# Patient Record
Sex: Female | Born: 1990 | Race: Black or African American | Hispanic: No | Marital: Single | Smoking: Never smoker
Health system: Southern US, Community
[De-identification: ages and names within clinical notes are randomized; demographics above are authoritative.]

## PROBLEM LIST (undated history)

## (undated) DIAGNOSIS — K219 Gastro-esophageal reflux disease without esophagitis: Secondary | ICD-10-CM

## (undated) DIAGNOSIS — B009 Herpesviral infection, unspecified: Secondary | ICD-10-CM

## (undated) DIAGNOSIS — D649 Anemia, unspecified: Secondary | ICD-10-CM

## (undated) DIAGNOSIS — A64 Unspecified sexually transmitted disease: Secondary | ICD-10-CM

## (undated) DIAGNOSIS — Z789 Other specified health status: Secondary | ICD-10-CM

## (undated) HISTORY — PX: NO PAST SURGERIES: SHX2092

## (undated) HISTORY — DX: Unspecified sexually transmitted disease: A64

---

## 2011-09-22 ENCOUNTER — Emergency Department (HOSPITAL_COMMUNITY)
Admission: EM | Admit: 2011-09-22 | Discharge: 2011-09-22 | Disposition: A | Discharge status: Home / Self Care | Payer: No Typology Code available for payment source | Attending: Emergency Medicine | Admitting: Emergency Medicine

## 2011-09-22 ENCOUNTER — Emergency Department (HOSPITAL_COMMUNITY): Payer: No Typology Code available for payment source

## 2011-09-22 ENCOUNTER — Encounter (HOSPITAL_COMMUNITY): Payer: Self-pay | Admitting: Emergency Medicine

## 2011-09-22 DIAGNOSIS — R109 Unspecified abdominal pain: Secondary | ICD-10-CM | POA: Insufficient documentation

## 2011-09-22 DIAGNOSIS — S161XXA Strain of muscle, fascia and tendon at neck level, initial encounter: Secondary | ICD-10-CM

## 2011-09-22 DIAGNOSIS — M542 Cervicalgia: Secondary | ICD-10-CM | POA: Insufficient documentation

## 2011-09-22 DIAGNOSIS — M25519 Pain in unspecified shoulder: Secondary | ICD-10-CM | POA: Insufficient documentation

## 2011-09-22 DIAGNOSIS — S139XXA Sprain of joints and ligaments of unspecified parts of neck, initial encounter: Secondary | ICD-10-CM | POA: Insufficient documentation

## 2011-09-22 LAB — URINALYSIS, ROUTINE W REFLEX MICROSCOPIC
Leukocytes, UA: NEGATIVE
Nitrite: NEGATIVE
Specific Gravity, Urine: 1.031 — ABNORMAL HIGH (ref 1.005–1.030)
Urobilinogen, UA: 1 mg/dL (ref 0.0–1.0)
pH: 6 (ref 5.0–8.0)

## 2011-09-22 MED ORDER — IBUPROFEN 800 MG PO TABS: 800.0000 mg | ORAL_TABLET | Freq: Three times a day (TID) | ORAL | Status: AC

## 2011-09-22 MED ORDER — DIAZEPAM 5 MG PO TABS: 5.0000 mg | ORAL_TABLET | Freq: Two times a day (BID) | ORAL | Status: AC

## 2011-09-22 MED ORDER — HYDROCODONE-ACETAMINOPHEN 5-325 MG PO TABS
1.0000 | ORAL_TABLET | Freq: Once | ORAL | Status: AC
  Administered 2011-09-22: 1 via ORAL
  Filled 2011-09-22: qty 1

## 2011-09-22 NOTE — ED Notes (Signed)
Family at bedside. 
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
## 2011-09-23 NOTE — ED Provider Notes (Signed)
Medical screening examination/treatment/procedure(s) were performed by non-physician practitioner and as supervising physician I was immediately available for consultation/collaboration.   Gerhard Munch, MD 09/23/11 [phone]

## 2011-10-03 ENCOUNTER — Ambulatory Visit: Payer: BC Managed Care – PPO

## 2012-04-18 ENCOUNTER — Ambulatory Visit (INDEPENDENT_AMBULATORY_CARE_PROVIDER_SITE_OTHER): Payer: BC Managed Care – PPO | Admitting: Physician Assistant

## 2012-04-18 VITALS — BP 104/76 | HR 64 | Temp 98.2°F | Resp 16 | Ht 64.0 in | Wt 110.0 lb

## 2012-04-18 DIAGNOSIS — R350 Frequency of micturition: Secondary | ICD-10-CM

## 2012-04-18 DIAGNOSIS — R300 Dysuria: Secondary | ICD-10-CM

## 2012-04-18 LAB — POCT UA - MICROSCOPIC ONLY
Crystals, Ur, HPF, POC: NEGATIVE
Yeast, UA: NEGATIVE

## 2012-04-18 LAB — POCT URINALYSIS DIPSTICK
Ketones, UA: NEGATIVE
Nitrite, UA: NEGATIVE
Protein, UA: NEGATIVE
pH, UA: 7

## 2012-04-18 MED ORDER — CIPROFLOXACIN HCL 500 MG PO TABS: 500.0000 mg | ORAL_TABLET | Freq: Two times a day (BID) | ORAL | Status: DC

## 2012-04-18 NOTE — Progress Notes (Signed)
  Subjective:    Patient ID: Jeanne Harper, female    DOB: 02-Mar-1991, 21 y.o.   MRN: [id number]  HPI 21 year old female presents with acute onset of dysuria and urinary frequency.  States she had her annual GYN exam last week and was treated for yeast and bacterial vaginosis. Never had vaginal discharge and says she does not have any vaginal complaints now.  Denies abdominal pain, nausea, vomiting, flank pain, fevers, or chills.  She is currently on her menstrual cycle.     Review of Systems  All other systems reviewed and are negative.       Objective:   Physical Exam  Constitutional: She is oriented to person, place, and time. She appears well-developed and well-nourished.  HENT:  Head: Normocephalic and atraumatic.  Right Ear: External ear normal.  Left Ear: External ear normal.  Eyes: Conjunctivae normal are normal.  Neck: Normal range of motion.  Cardiovascular: Normal rate, regular rhythm and normal heart sounds.   Pulmonary/Chest: Effort normal and breath sounds normal.  Abdominal: Soft. Bowel sounds are normal. There is no tenderness (no CVA tenderness).  Musculoskeletal: Normal range of motion.  Neurological: She is alert and oriented to person, place, and time.  Psychiatric: She has a normal mood and affect. Her behavior is normal. Judgment and thought content normal.    Results for orders placed in visit on 04/18/12  POCT UA - MICROSCOPIC ONLY      Component Value Range   WBC, Ur, HPF, POC TNTC     RBC, urine, microscopic TNTC     Bacteria, U Microscopic 1+     Mucus, UA negative     Epithelial cells, urine per micros 2-7     Crystals, Ur, HPF, POC negative     Casts, Ur, LPF, POC negative     Yeast, UA negative    POCT URINALYSIS DIPSTICK      Component Value Range   Color, UA yellow     Clarity, UA cloudy     Glucose, UA negative     Bilirubin, UA negative     Ketones, UA negative     Spec Grav, UA 1.025     Blood, UA large     pH, UA 7.0     Protein, UA negative     Urobilinogen, UA 0.2     Nitrite, UA negative     Leukocytes, UA small (1+)           Assessment & Plan:   1. Dysuria  POCT UA - Microscopic Only, POCT urinalysis dipstick, Urine culture, ciprofloxacin (CIPRO) 500 MG tablet  2. Urinary frequency     Start Cipro 500 mg bid Urine culture sent Follow up if symptoms worsen or fail to improve.

## 2012-04-21 LAB — URINE CULTURE: Colony Count: 70000

## 2013-04-09 ENCOUNTER — Encounter: Payer: Self-pay | Admitting: Certified Nurse Midwife

## 2013-04-10 ENCOUNTER — Ambulatory Visit (INDEPENDENT_AMBULATORY_CARE_PROVIDER_SITE_OTHER): Payer: BC Managed Care – PPO | Admitting: Certified Nurse Midwife

## 2013-04-10 ENCOUNTER — Encounter: Payer: Self-pay | Admitting: Certified Nurse Midwife

## 2013-04-10 VITALS — BP 90/64 | HR 64 | Resp 16 | Ht 58.5 in | Wt 111.0 lb

## 2013-04-10 DIAGNOSIS — Z0000 Encounter for general adult medical examination without abnormal findings: Secondary | ICD-10-CM

## 2013-04-10 DIAGNOSIS — IMO0001 Reserved for inherently not codable concepts without codable children: Secondary | ICD-10-CM

## 2013-04-10 DIAGNOSIS — Z309 Encounter for contraceptive management, unspecified: Secondary | ICD-10-CM

## 2013-04-10 DIAGNOSIS — Z01419 Encounter for gynecological examination (general) (routine) without abnormal findings: Secondary | ICD-10-CM

## 2013-04-10 DIAGNOSIS — N760 Acute vaginitis: Secondary | ICD-10-CM

## 2013-04-10 MED ORDER — ETONOGESTREL-ETHINYL ESTRADIOL 0.12-0.015 MG/24HR VA RING: VAGINAL_RING | VAGINAL | Status: DC

## 2013-04-10 MED ORDER — METRONIDAZOLE 0.75 % VA GEL: 1.0000 | Freq: Every day | VAGINAL | Status: DC

## 2013-04-10 NOTE — Patient Instructions (Signed)
General topics  Next pap or exam is  due in 1 year Take a Women's multivitamin Take 1200 mg. of calcium daily - prefer dietary If any concerns in interim to call back  Breast Self-Awareness Practicing breast self-awareness may pick up problems early, prevent significant medical complications, and possibly save your life. By practicing breast self-awareness, you can become familiar with how your breasts look and feel and if your breasts are changing. This allows you to notice changes early. It can also offer you some reassurance that your breast health is good. One way to learn what is normal for your breasts and whether your breasts are changing is to do a breast self-exam. If you find a lump or something that was not present in the past, it is best to contact your caregiver right away. Other findings that should be evaluated by your caregiver include nipple discharge, especially if it is bloody; skin changes or reddening; areas where the skin seems to be pulled in (retracted); or new lumps and bumps. Breast pain is seldom associated with cancer (malignancy), but should also be evaluated by a caregiver. BREAST SELF-EXAM The best time to examine your breasts is 5 7 days after your menstrual period is over.  ExitCare Patient Information 2013 [employer].   Exercise to Stay Healthy Exercise helps you become and stay healthy. EXERCISE IDEAS AND TIPS Choose exercises that:  You enjoy.  Fit into your day. You do not need to exercise really hard to be healthy. You can do exercises at a slow or medium level and stay healthy. You can:  Stretch before and after working out.  Try yoga, Pilates, or tai chi.  Lift weights.  Walk fast, swim, jog, run, climb stairs, bicycle, dance, or rollerskate.  Take aerobic classes. Exercises that burn about 150 calories:  Running 1  miles in 15 minutes.  Playing volleyball for 45 to 60 minutes.  Washing and waxing a car for 45 to 60  minutes.  Playing touch football for 45 minutes.  Walking 1  miles in 35 minutes.  Pushing a stroller 1  miles in 30 minutes.  Playing basketball for 30 minutes.  Raking leaves for 30 minutes.  Bicycling 5 miles in 30 minutes.  Walking 2 miles in 30 minutes.  Dancing for 30 minutes.  Shoveling snow for 15 minutes.  Swimming laps for 20 minutes.  Walking up stairs for 15 minutes.  Bicycling 4 miles in 15 minutes.  Gardening for 30 to 45 minutes.  Jumping rope for 15 minutes.  Washing windows or floors for 45 to 60 minutes. Document Released: 08/13/2010 Document Revised: 10/03/2011 Document Reviewed: 08/13/2010 [employer] Patient Information 2013 [employer].   Other topics ( that may be useful information):    Sexually Transmitted Disease Sexually transmitted disease (STD) refers to any infection that is passed from person to person during sexual activity. This may happen by way of saliva, semen, blood, vaginal mucus, or urine. Common STDs include:  Gonorrhea.  Chlamydia.  Syphilis.  HIV/AIDS.  Genital herpes.  Hepatitis B and C.  Trichomonas.  Human papillomavirus (HPV).  Pubic lice. CAUSES  An STD may be spread by bacteria, virus, or parasite. A person can get an STD by:  Sexual intercourse with an infected person.  Sharing sex toys with an infected person.  Sharing needles with an infected person.  Having intimate contact with the genitals, mouth, or rectal areas of an infected person. SYMPTOMS  Some people may not have any symptoms, but   they can still pass the infection to others. Different STDs have different symptoms. Symptoms include:  Painful or bloody urination.  Pain in the pelvis, abdomen, vagina, anus, throat, or eyes.  Skin rash, itching, irritation, growths, or sores (lesions). These usually occur in the genital or anal area.  Abnormal vaginal discharge.  Penile discharge in men.  Soft, flesh-colored skin growths in the  genital or anal area.  Fever.  Pain or bleeding during sexual intercourse.  Swollen glands in the groin area.  Yellow skin and eyes (jaundice). This is seen with hepatitis. DIAGNOSIS  To make a diagnosis, your caregiver may:  Take a medical history.  Perform a physical exam.  Take a specimen (culture) to be examined.  Examine a sample of discharge under a microscope.  Perform blood test TREATMENT   Chlamydia, gonorrhea, trichomonas, and syphilis can be cured with antibiotic medicine.  Genital herpes, hepatitis, and HIV can be treated, but not cured, with prescribed medicines. The medicines will lessen the symptoms.  Genital warts from HPV can be treated with medicine or by freezing, burning (electrocautery), or surgery. Warts may come back.  HPV is a virus and cannot be cured with medicine or surgery.However, abnormal areas may be followed very closely by your caregiver and may be removed from the cervix, vagina, or vulva through office procedures or surgery. If your diagnosis is confirmed, your recent sexual partners need treatment. This is true even if they are symptom-free or have a negative culture or evaluation. They should not have sex until their caregiver says it is okay. HOME CARE INSTRUCTIONS  All sexual partners should be informed, tested, and treated for all STDs.  Take your antibiotics as directed. Finish them even if you start to feel better.  Only take over-the-counter or prescription medicines for pain, discomfort, or fever as directed by your caregiver.  Rest.  Eat a balanced diet and drink enough fluids to keep your urine clear or pale yellow.  Do not have sex until treatment is completed and you have followed up with your caregiver. STDs should be checked after treatment.  Keep all follow-up appointments, Pap tests, and blood tests as directed by your caregiver.  Only use latex condoms and water-soluble lubricants during sexual activity. Do not use  petroleum jelly or oils.  Avoid alcohol and illegal drugs.  Get vaccinated for HPV and hepatitis. If you have not received these vaccines in the past, talk to your caregiver about whether one or both might be right for you.  Avoid risky sex practices that can break the skin. The only way to avoid getting an STD is to avoid all sexual activity.Latex condoms and dental dams (for oral sex) will help lessen the risk of getting an STD, but will not completely eliminate the risk. SEEK MEDICAL CARE IF:   You have a fever.  You have any new or worsening symptoms. Document Released: 10/01/2002 Document Revised: 10/03/2011 Document Reviewed: 10/08/2010 Exit[employer] Patient Information 2013 [employer].    Domestic Abuse You are being battered or abused if someone close to you hits, pushes, or physically hurts you in any way. You also are being abused if you are forced into activities. You are being sexually abused if you are forced to have sexual contact of any kind. You are being emotionally abused if you are made to feel worthless or if you are constantly threatened. It is important to remember that help is available. No one has the right to abuse you. PREVENTION OF FURTHER   ABUSE  Learn the warning signs of danger. This varies with situations but may include: the use of alcohol, threats, isolation from friends and family, or forced sexual contact. Leave if you feel that violence is going to occur.  If you are attacked or beaten, report it to the police so the abuse is documented. You do not have to press charges. The police can protect you while you or the attackers are leaving. Get the officer's name and badge number and a copy of the report.  Find someone you can trust and tell them what is happening to you: your caregiver, a nurse, clergy member, close friend or family member. Feeling ashamed is natural, but remember that you have done nothing wrong. No one deserves abuse. Document Released:  07/08/2000 Document Revised: 10/03/2011 Document Reviewed: 09/16/2010 Exit[employer] Patient Information 2013 [employer].    How Much is Too Much Alcohol? Drinking too much alcohol can cause injury, accidents, and health problems. These types of problems can include:   Car crashes.  Falls.  Family fighting (domestic violence).  Drowning.  Fights.  Injuries.  Burns.  Damage to certain organs.  Having a baby with birth defects. ONE DRINK CAN BE TOO MUCH WHEN YOU ARE:  Working.  Pregnant or breastfeeding.  Taking medicines. Ask your doctor.  Driving or planning to drive. If you or someone you know has a drinking problem, get help from a doctor.  Document Released: 05/07/2009 Document Revised: 10/03/2011 Document Reviewed: 05/07/2009 Exit[employer] Patient Information 2013 [employer].   Smoking Hazards Smoking cigarettes is extremely bad for your health. Tobacco smoke has over 200 known poisons in it. There are over 60 chemicals in tobacco smoke that cause cancer. Some of the chemicals found in cigarette smoke include:   Cyanide.  Benzene.  Formaldehyde.  Methanol (wood alcohol).  Acetylene (fuel used in welding torches).  Ammonia. Cigarette smoke also contains the poisonous gases nitrogen oxide and carbon monoxide.  Cigarette smokers have an increased risk of many serious medical problems and Smoking causes approximately:  90% of all lung cancer deaths in men.  80% of all lung cancer deaths in women.  90% of deaths from chronic obstructive lung disease. Compared with nonsmokers, smoking increases the risk of:  Coronary heart disease by 2 to 4 times.  Stroke by 2 to 4 times.  Men developing lung cancer by 23 times.  Women developing lung cancer by 13 times.  Dying from chronic obstructive lung diseases by 12 times.  . Smoking is the most preventable cause of death and disease in our society.  WHY IS SMOKING ADDICTIVE?  Nicotine is the chemical  agent in tobacco that is capable of causing addiction or dependence.  When you smoke and inhale, nicotine is absorbed rapidly into the bloodstream through your lungs. Nicotine absorbed through the lungs is capable of creating a powerful addiction. Both inhaled and non-inhaled nicotine may be addictive.  Addiction studies of cigarettes and spit tobacco show that addiction to nicotine occurs mainly during the teen years, when young people begin using tobacco products. WHAT ARE THE BENEFITS OF QUITTING?  There are many health benefits to quitting smoking.   Likelihood of developing cancer and heart disease decreases. Health improvements are seen almost immediately.  Blood pressure, pulse rate, and breathing patterns start returning to normal soon after quitting. QUITTING SMOKING   American Lung Association - 1-800-LUNGUSA  American Cancer Society - 1-800-ACS-2345 Document Released: 08/18/2004 Document Revised: 10/03/2011 Document Reviewed: 04/22/2009 Exit[employer] Patient Information 2013 [employer].   Stress Management Stress is a state of physical or mental tension that often results from changes in your life or normal routine. Some common causes of stress are:  Death of a loved one.  Injuries or severe illnesses.  Getting fired or changing jobs.  Moving into a new home. Other causes may be:  Sexual problems.  Business or financial losses.  Taking on a large debt.  Regular conflict with someone at home or at work.  Constant tiredness from lack of sleep. It is not just bad things that are stressful. It may be stressful to:  Win the lottery.  Get married.  Buy a new car. The amount of stress that can be easily tolerated varies from person to person. Changes generally cause stress, regardless of the types of change. Too much stress can affect your health. It may lead to physical or emotional problems. Too little stress (boredom) may also become stressful. SUGGESTIONS TO  REDUCE STRESS:  Talk things over with your family and friends. It often is helpful to share your concerns and worries. If you feel your problem is serious, you may want to get help from a professional counselor.  Consider your problems one at a time instead of lumping them all together. Trying to take care of everything at once may seem impossible. List all the things you need to do and then start with the most important one. Set a goal to accomplish 2 or 3 things each day. If you expect to do too many in a single day you will naturally fail, causing you to feel even more stressed.  Do not use alcohol or drugs to relieve stress. Although you may feel better for a short time, they do not remove the problems that caused the stress. They can also be habit forming.  Exercise regularly - at least 3 times per week. Physical exercise can help to relieve that "uptight" feeling and will relax you.  The shortest distance between despair and hope is often a good night's sleep.  Go to bed and get up on time allowing yourself time for appointments without being rushed.  Take a short "time-out" period from any stressful situation that occurs during the day. Close your eyes and take some deep breaths. Starting with the muscles in your face, tense them, hold it for a few seconds, then relax. Repeat this with the muscles in your neck, shoulders, hand, stomach, back and legs.  Take good care of yourself. Eat a balanced diet and get plenty of rest.  Schedule time for having fun. Take a break from your daily routine to relax. HOME CARE INSTRUCTIONS   Call if you feel overwhelmed by your problems and feel you can no longer manage them on your own.  Return immediately if you feel like hurting yourself or someone else. Document Released: 01/04/2001 Document Revised: 10/03/2011 Document Reviewed: 08/27/2007 Exit[employer] Patient Information 2013 [employer].   

## 2013-04-10 NOTE — Progress Notes (Signed)
22 y.o. G0P0000 Single African American Fe here for annual exam.  Periods normal, no issues. No STD screening needed, no partner change. Contraception condoms but would like to try Nuvaring, had a problem remembering pills. Also has noticed increased vaginal discharge, no burning or itching, slight odor. Denies new personal products. No other health issues.  Patient's last menstrual period was 03/13/2013.          Sexually active: yes  The current method of family planning is condoms most of the time.    Exercising: yes  cardio & strength training Smoker:  no  Health Maintenance: Pap: 04-09-12 neg MMG:  none Colonoscopy:  none BMD:   none TDaP:  2008 Labs:Hgb-13.4 Self breast exam: done occ   reports that she has never smoked. She does not have any smokeless tobacco history on file. She reports that  drinks alcohol. She reports that she does not use illicit drugs.  History reviewed. No pertinent past medical history.  History reviewed. No pertinent past surgical history.  Current Outpatient Prescriptions  Medication Sig Dispense Refill  . IBUPROFEN PO Take by mouth as needed.       No current facility-administered medications for this visit.    Family History  Problem Relation Age of Onset  . Hypertension Mother     ROS:  Pertinent items are noted in HPI.  Otherwise, a comprehensive ROS was negative.  Exam:   BP 90/64  Pulse 64  Resp 16  Ht 4' 10.5" (1.486 m)  Wt 111 lb (50.349 kg)  BMI 22.8 kg/m2  LMP 03/13/2013 Height: 4' 10.5" (148.6 cm)  Ht Readings from Last 3 Encounters:  04/10/13 4' 10.5" (1.486 m)  04/18/12 5\' 4"  (1.626 m)    General appearance: alert, cooperative and appears stated age Head: Normocephalic, without obvious abnormality, atraumatic Neck: no adenopathy, supple, symmetrical, trachea midline and thyroid normal to inspection and palpation Lungs: clear to auscultation bilaterally Breasts: normal appearance, no masses or tenderness, No nipple  retraction or dimpling, No nipple discharge or bleeding, No axillary or supraclavicular adenopathy Heart: regular rate and rhythm Abdomen: soft, non-tender; no masses,  no organomegaly Extremities: extremities normal, atraumatic, no cyanosis or edema Skin: Skin color, texture, turgor normal. No rashes or lesions Lymph nodes: Cervical, supraclavicular, and axillary nodes normal. No abnormal inguinal nodes palpated Neurologic: Grossly normal   Pelvic: External genitalia:  no lesions              Urethra:  normal appearing urethra with no masses, tenderness or lesions              Bartholin's and Skene's: normal                 Vagina: normal appearing vagina with normal color and odorous  discharge, no lesions ph 5.0  Wet Prep taken              Cervix: normal ,non tender              Pap taken: no Bimanual Exam:  Uterus:  normal size, contour, position, consistency, mobility, non-tender and anteverted              Adnexa: normal adnexa and no mass, fullness, tenderness               Rectovaginal: Confirms               Anus: deferred  Wet Prep: Positive for BV, negative for yeast, trich  A:  Well Woman  with normal exam  Contraception Nuvaring desired  BV  P:   Reviewed health and wellness pertinent to exam  Rx Nuvaring see order.  Patient inserted and removed Nuvaring without problems. Provider checked for proper placement. Instructions given for use.  Reviewed findings. Rx Metrogel see order  Pap smear as per guidelines   pap smear not taken today  counseled on breast self exam, STD prevention, adequate intake of calcium and vitamin D, diet and exercise  return annually or prn  An After Visit Summary was printed and given to the patient.

## 2013-04-15 NOTE — Progress Notes (Signed)
Note reviewed, agree with plan.  Renarda Mullinix, MD  

## 2013-04-26 ENCOUNTER — Telehealth: Payer: Self-pay | Admitting: Certified Nurse Midwife

## 2013-04-26 NOTE — Telephone Encounter (Signed)
Message left to return call to Jennefer Kopp at [phone].    

## 2013-04-26 NOTE — Telephone Encounter (Signed)
Patient has some questions about the Birth Control she just started.

## 2013-04-30 NOTE — Telephone Encounter (Signed)
Message left to return call to Breella Vanostrand at [phone].    

## 2014-04-14 ENCOUNTER — Ambulatory Visit: Payer: BC Managed Care – PPO | Admitting: Certified Nurse Midwife

## 2014-04-14 ENCOUNTER — Encounter: Payer: Self-pay | Admitting: Certified Nurse Midwife

## 2014-04-18 ENCOUNTER — Other Ambulatory Visit: Payer: Self-pay | Admitting: Gastroenterology

## 2014-04-18 DIAGNOSIS — R1084 Generalized abdominal pain: Secondary | ICD-10-CM

## 2014-04-29 ENCOUNTER — Ambulatory Visit
Admission: RE | Admit: 2014-04-29 | Discharge: 2014-04-29 | Disposition: A | Discharge status: Home / Self Care | Payer: BC Managed Care – PPO | Source: Ambulatory Visit | Attending: Gastroenterology | Admitting: Gastroenterology

## 2014-04-29 DIAGNOSIS — R1084 Generalized abdominal pain: Secondary | ICD-10-CM

## 2014-08-22 ENCOUNTER — Encounter: Payer: Self-pay | Admitting: Nurse Practitioner

## 2014-09-08 ENCOUNTER — Ambulatory Visit: Payer: Self-pay | Admitting: Certified Nurse Midwife

## 2015-06-17 IMAGING — US US ABDOMEN COMPLETE
1 series · 14 of 25 positions shown · non-contrast
Comparison: None.

CLINICAL DATA: Initial visit generalized abdominal pain

EXAM:
ULTRASOUND ABDOMEN COMPLETE

[Series 1: us abdomen complete · 0.20mm/px · 14 of 90 slices shown]
[im 1/90]
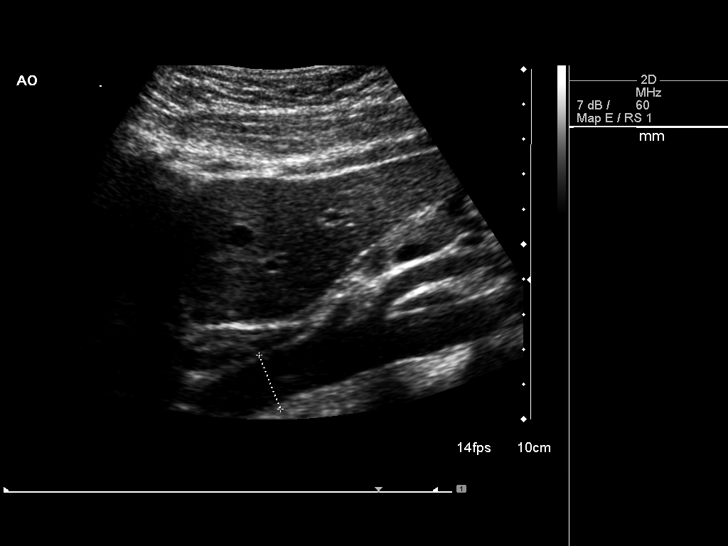
[im 8/90]
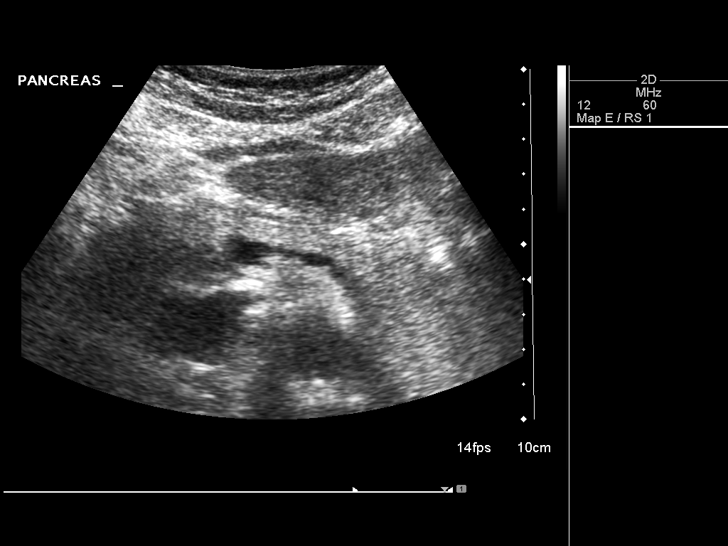
[im 15/90]
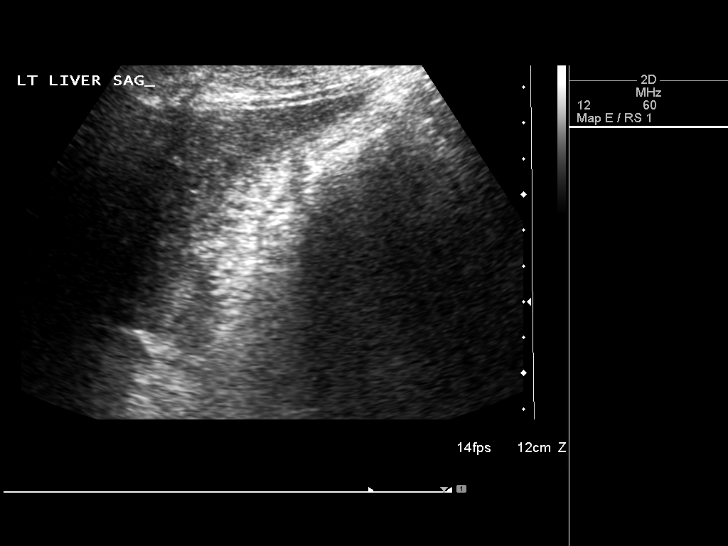
[im 23/90]
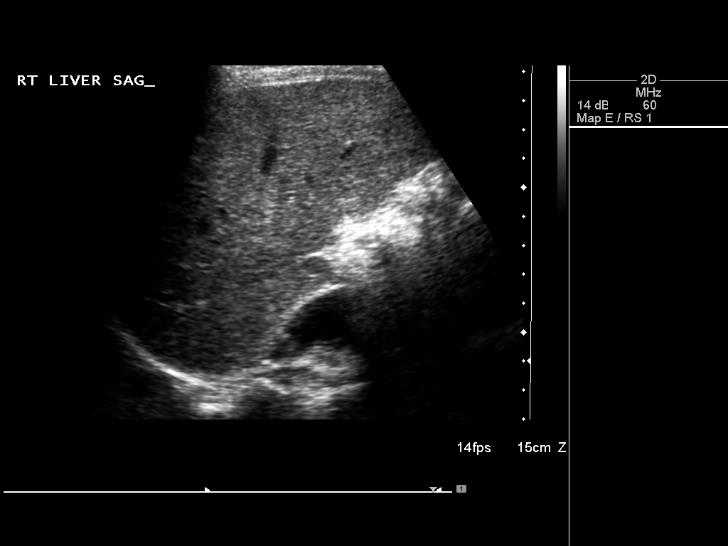
[im 30/90]
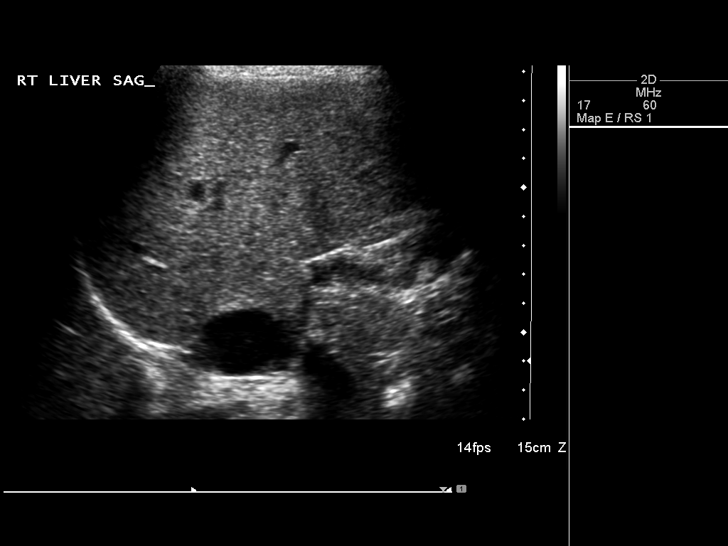
[im 34/90]
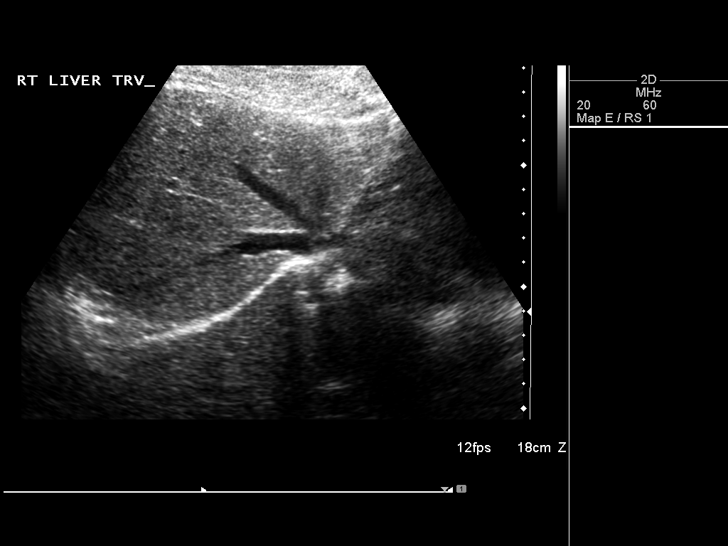
[im 41/90]
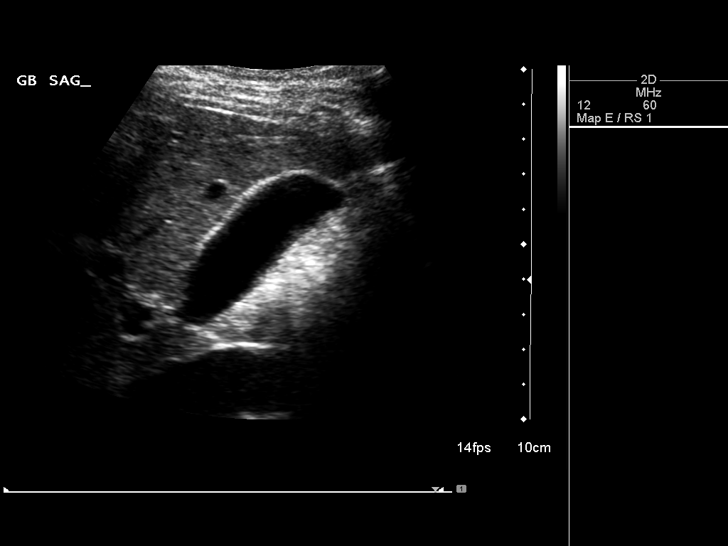
[im 49/90]
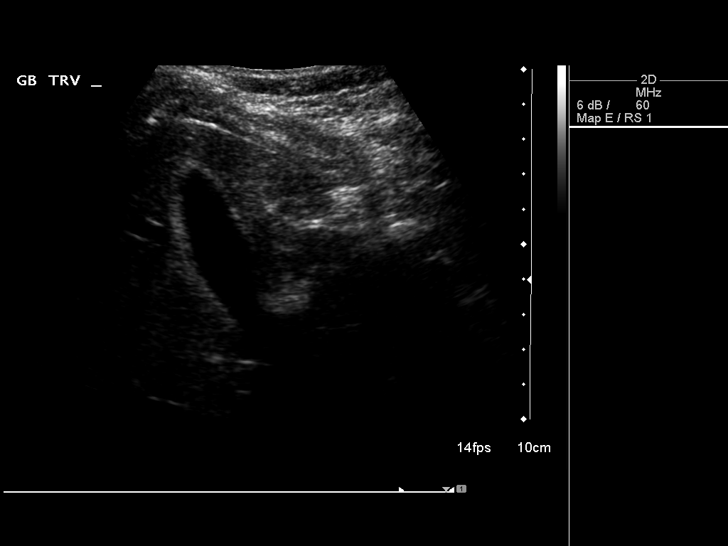
[im 56/90]
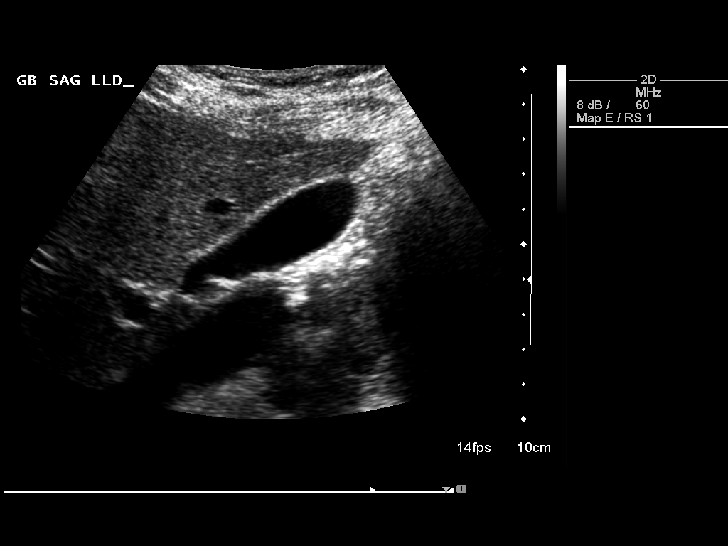
[im 60/90]
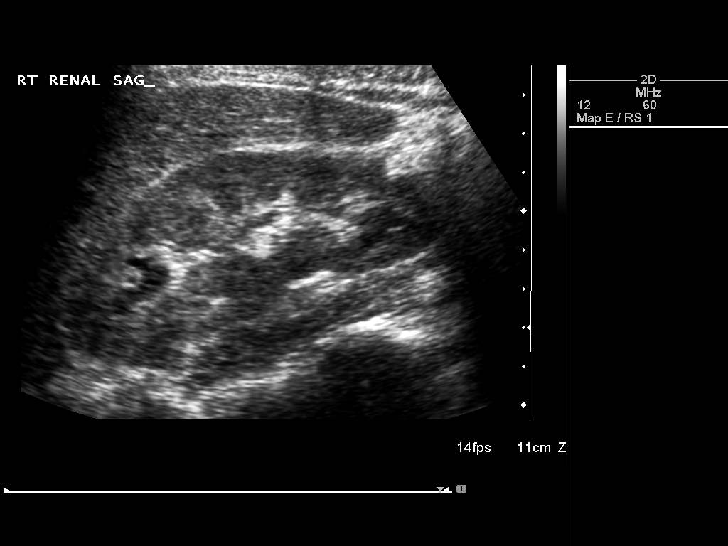
[im 67/90]
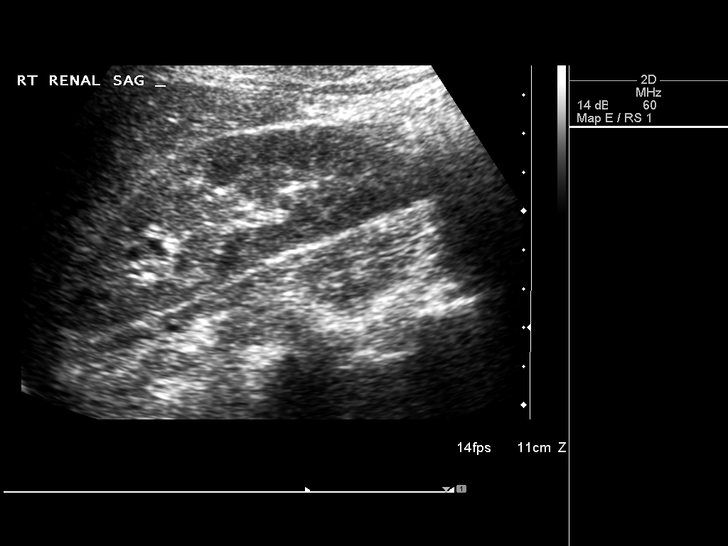
[im 75/90]
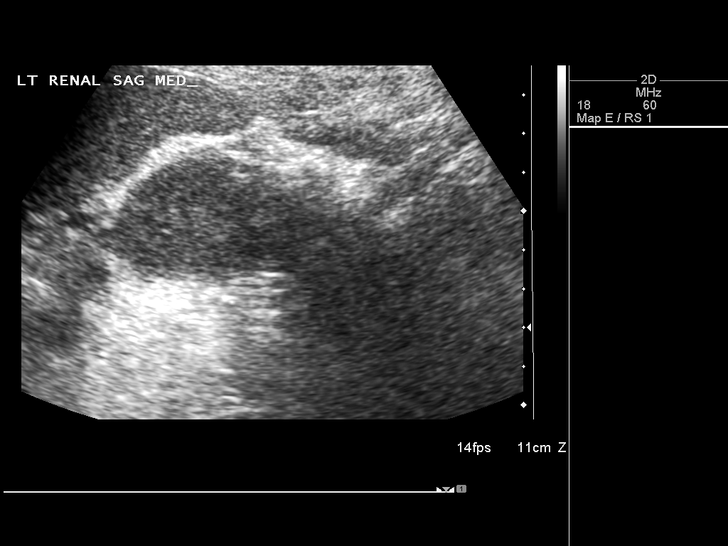
[im 82/90]
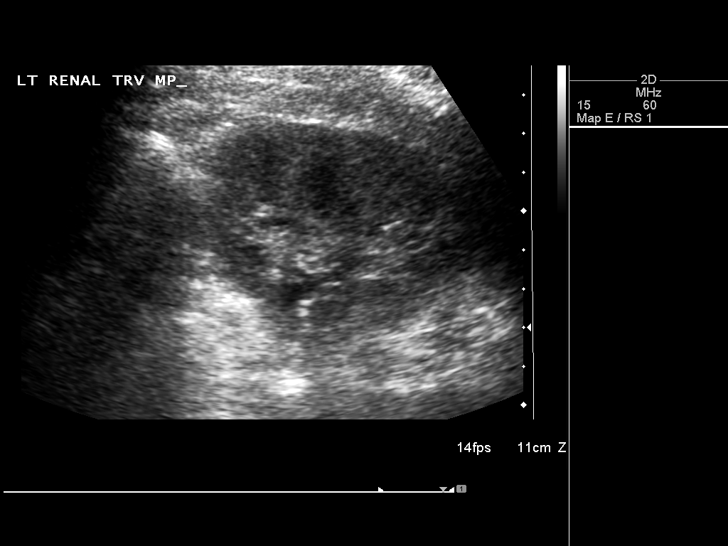
[im 90/90]
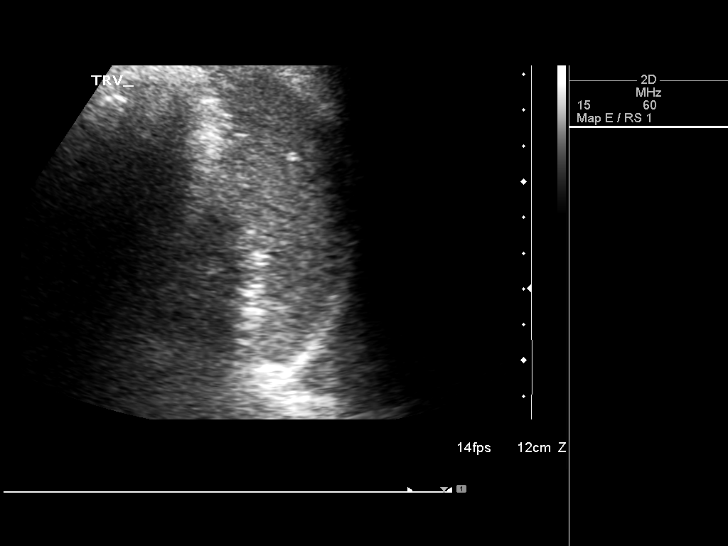

[14 of 25 positions shown; findings below may reference images not displayed]

FINDINGS: Gallbladder: No gallstones or wall thickening visualized. No
sonographic Murphy sign noted.

Common bile duct: Diameter: 4 mm

Liver: No focal lesion identified. Within normal limits in
parenchymal echogenicity.

IVC: No abnormality visualized.

Pancreas: Visualized portion unremarkable.

Spleen: Size and appearance within normal limits.

Right Kidney: Length: 10.8 cm. Echogenicity within normal limits. No
mass or hydronephrosis visualized.

Left Kidney: Length: 10.0 cm. Echogenicity within normal limits. No
mass or hydronephrosis visualized.

Abdominal aorta: No aneurysm visualized.

Other findings: None.
IMPRESSION: Normal abdomen ultrasound

## 2015-07-31 ENCOUNTER — Encounter: Payer: Self-pay | Admitting: Certified Nurse Midwife

## 2015-07-31 ENCOUNTER — Ambulatory Visit (INDEPENDENT_AMBULATORY_CARE_PROVIDER_SITE_OTHER): Payer: Commercial Managed Care - PPO | Admitting: Certified Nurse Midwife

## 2015-07-31 VITALS — BP 110/78 | HR 70 | Resp 16 | Ht 58.5 in | Wt 115.0 lb

## 2015-07-31 DIAGNOSIS — A499 Bacterial infection, unspecified: Secondary | ICD-10-CM

## 2015-07-31 DIAGNOSIS — N760 Acute vaginitis: Secondary | ICD-10-CM | POA: Diagnosis not present

## 2015-07-31 DIAGNOSIS — Z113 Encounter for screening for infections with a predominantly sexual mode of transmission: Secondary | ICD-10-CM | POA: Diagnosis not present

## 2015-07-31 DIAGNOSIS — B9689 Other specified bacterial agents as the cause of diseases classified elsewhere: Secondary | ICD-10-CM

## 2015-07-31 MED ORDER — METRONIDAZOLE 0.75 % VA GEL: VAGINAL | Status: DC

## 2015-07-31 NOTE — Progress Notes (Signed)
25 yo white female g0p0 here with complaint of vaginal symptoms of itching, burning, and increase discharge. Describes discharge as brown watery with odor.. Partner change x 2, would like STD screening.Onset of symptoms 30 days ago. Denies new personal products except for soaps. Urinary symptoms no . Contraception is occasional condoms.  Just finishing period now. No other health issues today.  O:Healthy female WDWN Affect: normal, orientation x 3  Exam: Abdomen:soft,non tender Lymph node: no enlargement or tenderness Pelvic exam: External genital: normal female BUS: negative Vagina: odorous watery  discharge noted. Ph:5.0   ,Wet prep taken, Affirm taken Cervix: normal, non tender, no CMT Uterus: normal, non tender Adnexa:normal, non tender, no masses or fullness noted   Wet Prep results: positive clue   A:Normal pelvic exam BV R/O STD   P:Discussed findings of BV and etiology. Discussed Aveeno or baking soda sitz bath for comfort. Avoid moist clothes or pads for extended period of time. If working out in gym clothes for long periods of time change underwear. Rx Metrogel see order Lab: Affirm, Gc,Chlamydia Patient will have blood screening for STD's at [facility].  Rv prn

## 2015-07-31 NOTE — Patient Instructions (Signed)

## 2015-08-01 LAB — WET PREP BY MOLECULAR PROBE
Candida species: NEGATIVE
Gardnerella vaginalis: POSITIVE — AB
Trichomonas vaginosis: NEGATIVE

## 2015-08-03 NOTE — Progress Notes (Signed)
Reviewed personally.  M. Suzanne Palmyra Rogacki, MD.  

## 2015-08-04 ENCOUNTER — Encounter: Payer: Self-pay | Admitting: Certified Nurse Midwife

## 2015-08-04 ENCOUNTER — Ambulatory Visit (INDEPENDENT_AMBULATORY_CARE_PROVIDER_SITE_OTHER): Payer: Commercial Managed Care - PPO | Admitting: Certified Nurse Midwife

## 2015-08-04 VITALS — BP 102/70 | HR 68 | Resp 16 | Ht 58.75 in | Wt 114.0 lb

## 2015-08-04 DIAGNOSIS — Z124 Encounter for screening for malignant neoplasm of cervix: Secondary | ICD-10-CM

## 2015-08-04 DIAGNOSIS — Z0000 Encounter for general adult medical examination without abnormal findings: Secondary | ICD-10-CM | POA: Diagnosis not present

## 2015-08-04 DIAGNOSIS — Z01419 Encounter for gynecological examination (general) (routine) without abnormal findings: Secondary | ICD-10-CM | POA: Diagnosis not present

## 2015-08-04 LAB — IPS N GONORRHOEA AND CHLAMYDIA BY PCR

## 2015-08-04 NOTE — Progress Notes (Signed)
Reviewed personally.  M. Suzanne Margert Edsall, MD.  

## 2015-08-04 NOTE — Progress Notes (Signed)
25 y.o. G0P0000 Single  African American Fe here for annual exam.Periods normal, no issues. Contraception none, her choice, aware of risk  Pregnancy. Patient seen last week for vaginal odor and had Rx Metrogel sent in pharmacy out of medication, so patient picking up today. No change in symptoms. Desires STD blood screening only today and pap smear. Sees Urgent care if needed. No other health issues today. Had fun sledding in the snow!  Patient's last menstrual period was 07/24/2015.          Sexually active: Yes.    The current method of family planning is none.    Exercising: Yes.    cardio & strength Smoker:  no  Health Maintenance: Pap:  04-09-12 neg MMG:  none Colonoscopy:  none BMD:   none TDaP:  2008 Shingles: no Pneumonia: no Hep C and HIV: 2013 HIV neg Labs: hgb- Self breast exam: done occ   reports that she has never smoked. She does not have any smokeless tobacco history on file. She reports that she drinks alcohol. She reports that she does not use illicit drugs.  History reviewed. No pertinent past medical history.  History reviewed. No pertinent past surgical history.  Current Outpatient Prescriptions  Medication Sig Dispense Refill  . IBUPROFEN PO Take by mouth as needed.    . metroNIDAZOLE (METROGEL) 0.75 % vaginal gel One applicator vaginally every hs x 5 (Patient not taking: Reported on 08/04/2015) 70 g 0   No current facility-administered medications for this visit.    Family History  Problem Relation Age of Onset  . Hypertension Mother     ROS:  Pertinent items are noted in HPI.  Otherwise, a comprehensive ROS was negative.  Exam:   BP 102/70 mmHg  Pulse 68  Resp 16  Ht 4' 10.75" (1.492 m)  Wt 114 lb (51.71 kg)  BMI 23.23 kg/m2  LMP 07/24/2015 Height: 4' 10.75" (149.2 cm) Ht Readings from Last 3 Encounters:  08/04/15 4' 10.75" (1.492 m)  07/31/15 4' 10.5" (1.486 m)  04/10/13 4' 10.5" (1.486 m)    General appearance: alert, cooperative and  appears stated age Head: Normocephalic, without obvious abnormality, atraumatic Neck: no adenopathy, supple, symmetrical, trachea midline and thyroid normal to inspection and palpation Lungs: clear to auscultation bilaterally Breasts: normal appearance, no masses or tenderness, No nipple retraction or dimpling, No nipple discharge or bleeding, No axillary or supraclavicular adenopathy Heart: regular rate and rhythm Abdomen: soft, non-tender; no masses,  no organomegaly Extremities: extremities normal, atraumatic, no cyanosis or edema Skin: Skin color, texture, turgor normal. No rashes or lesions Lymph nodes: Cervical, supraclavicular, and axillary nodes normal. No abnormal inguinal nodes palpated Neurologic: Grossly normal   Pelvic: External genitalia:  no lesions              Urethra:  normal appearing urethra with no masses, tenderness or lesions              Bartholin's and Skene's: normal                 Vagina: normal appearing vagina with normal color and discharge, no lesions              Cervix: normal appearance, no lesions or tenderness              Pap taken: Yes.   Bimanual Exam:  Uterus:  normal size, contour, position, consistency, mobility, non-tender              Adnexa:  normal adnexa and no mass, fullness, tenderness               Rectovaginal: Confirms               Anus:  Normal appearance  Chaperone present: yes  A:  Well Woman with normal exam  Contraception none her choice  STD serum screening  BV picking up Rx from visit last week today  GC/Chlamydia pending  P:   Reviewed health and wellness pertinent to exam  Lab: HIV,Hep.C, STD panel HSV 1,2  Pap smear as above with HPV reflex   counseled on breast self exam, STD prevention, HIV risk factors and prevention, adequate intake of calcium and vitamin D, diet and exercise  return annually or prn  An After Visit Summary was printed and given to the patient.  f

## 2015-08-04 NOTE — Patient Instructions (Signed)
General topics  Next pap or exam is  due in 1 year Take a Women's multivitamin Take 1200 mg. of calcium daily - prefer dietary If any concerns in interim to call back  Breast Self-Awareness Practicing breast self-awareness may pick up problems early, prevent significant medical complications, and possibly save your life. By practicing breast self-awareness, you can become familiar with how your breasts look and feel and if your breasts are changing. This allows you to notice changes early. It can also offer you some reassurance that your breast health is good. One way to learn what is normal for your breasts and whether your breasts are changing is to do a breast self-exam. If you find a lump or something that was not present in the past, it is best to contact your caregiver right away. Other findings that should be evaluated by your caregiver include nipple discharge, especially if it is bloody; skin changes or reddening; areas where the skin seems to be pulled in (retracted); or new lumps and bumps. Breast pain is seldom associated with cancer (malignancy), but should also be evaluated by a caregiver. BREAST SELF-EXAM The best time to examine your breasts is 5 7 days after your menstrual period is over.  ExitCare Patient Information 2013 [employer].   Exercise to Stay Healthy Exercise helps you become and stay healthy. EXERCISE IDEAS AND TIPS Choose exercises that:  You enjoy.  Fit into your day. You do not need to exercise really hard to be healthy. You can do exercises at a slow or medium level and stay healthy. You can:  Stretch before and after working out.  Try yoga, Pilates, or tai chi.  Lift weights.  Walk fast, swim, jog, run, climb stairs, bicycle, dance, or rollerskate.  Take aerobic classes. Exercises that burn about 150 calories:  Running 1  miles in 15 minutes.  Playing volleyball for 45 to 60 minutes.  Washing and waxing a car for 45 to 60  minutes.  Playing touch football for 45 minutes.  Walking 1  miles in 35 minutes.  Pushing a stroller 1  miles in 30 minutes.  Playing basketball for 30 minutes.  Raking leaves for 30 minutes.  Bicycling 5 miles in 30 minutes.  Walking 2 miles in 30 minutes.  Dancing for 30 minutes.  Shoveling snow for 15 minutes.  Swimming laps for 20 minutes.  Walking up stairs for 15 minutes.  Bicycling 4 miles in 15 minutes.  Gardening for 30 to 45 minutes.  Jumping rope for 15 minutes.  Washing windows or floors for 45 to 60 minutes. Document Released: 08/13/2010 Document Revised: 10/03/2011 Document Reviewed: 08/13/2010 ExitCare Patient Information 2013 [employer].   Other topics ( that may be useful information):    Sexually Transmitted Disease Sexually transmitted disease (STD) refers to any infection that is passed from person to person during sexual activity. This may happen by way of saliva, semen, blood, vaginal mucus, or urine. Common STDs include:  Gonorrhea.  Chlamydia.  Syphilis.  HIV/AIDS.  Genital herpes.  Hepatitis B and C.  Trichomonas.  Human papillomavirus (HPV).  Pubic lice. CAUSES  An STD may be spread by bacteria, virus, or parasite. A person can get an STD by:  Sexual intercourse with an infected person.  Sharing sex toys with an infected person.  Sharing needles with an infected person.  Having intimate contact with the genitals, mouth, or rectal areas of an infected person. SYMPTOMS  Some people may not have any symptoms, but   they can still pass the infection to others. Different STDs have different symptoms. Symptoms include:  Painful or bloody urination.  Pain in the pelvis, abdomen, vagina, anus, throat, or eyes.  Skin rash, itching, irritation, growths, or sores (lesions). These usually occur in the genital or anal area.  Abnormal vaginal discharge.  Penile discharge in men.  Soft, flesh-colored skin growths in the  genital or anal area.  Fever.  Pain or bleeding during sexual intercourse.  Swollen glands in the groin area.  Yellow skin and eyes (jaundice). This is seen with hepatitis. DIAGNOSIS  To make a diagnosis, your caregiver may:  Take a medical history.  Perform a physical exam.  Take a specimen (culture) to be examined.  Examine a sample of discharge under a microscope.  Perform blood test TREATMENT   Chlamydia, gonorrhea, trichomonas, and syphilis can be cured with antibiotic medicine.  Genital herpes, hepatitis, and HIV can be treated, but not cured, with prescribed medicines. The medicines will lessen the symptoms.  Genital warts from HPV can be treated with medicine or by freezing, burning (electrocautery), or surgery. Warts may come back.  HPV is a virus and cannot be cured with medicine or surgery.However, abnormal areas may be followed very closely by your caregiver and may be removed from the cervix, vagina, or vulva through office procedures or surgery. If your diagnosis is confirmed, your recent sexual partners need treatment. This is true even if they are symptom-free or have a negative culture or evaluation. They should not have sex until their caregiver says it is okay. HOME CARE INSTRUCTIONS  All sexual partners should be informed, tested, and treated for all STDs.  Take your antibiotics as directed. Finish them even if you start to feel better.  Only take over-the-counter or prescription medicines for pain, discomfort, or fever as directed by your caregiver.  Rest.  Eat a balanced diet and drink enough fluids to keep your urine clear or pale yellow.  Do not have sex until treatment is completed and you have followed up with your caregiver. STDs should be checked after treatment.  Keep all follow-up appointments, Pap tests, and blood tests as directed by your caregiver.  Only use latex condoms and water-soluble lubricants during sexual activity. Do not use  petroleum jelly or oils.  Avoid alcohol and illegal drugs.  Get vaccinated for HPV and hepatitis. If you have not received these vaccines in the past, talk to your caregiver about whether one or both might be right for you.  Avoid risky sex practices that can break the skin. The only way to avoid getting an STD is to avoid all sexual activity.Latex condoms and dental dams (for oral sex) will help lessen the risk of getting an STD, but will not completely eliminate the risk. SEEK MEDICAL CARE IF:   You have a fever.  You have any new or worsening symptoms. Document Released: 10/01/2002 Document Revised: 10/03/2011 Document Reviewed: 10/08/2010 [facility] Patient Information 2013 [employer].    Domestic Abuse You are being battered or abused if someone close to you hits, pushes, or physically hurts you in any way. You also are being abused if you are forced into activities. You are being sexually abused if you are forced to have sexual contact of any kind. You are being emotionally abused if you are made to feel worthless or if you are constantly threatened. It is important to remember that help is available. No one has the right to abuse you. PREVENTION OF FURTHER  ABUSE  Learn the warning signs of danger. This varies with situations but may include: the use of alcohol, threats, isolation from friends and family, or forced sexual contact. Leave if you feel that violence is going to occur.  If you are attacked or beaten, report it to the police so the abuse is documented. You do not have to press charges. The police can protect you while you or the attackers are leaving. Get the officer's name and badge number and a copy of the report.  Find someone you can trust and tell them what is happening to you: your caregiver, a nurse, clergy member, close friend or family member. Feeling ashamed is natural, but remember that you have done nothing wrong. No one deserves abuse. Document Released:  07/08/2000 Document Revised: 10/03/2011 Document Reviewed: 09/16/2010 ExitCare Patient Information 2013 [employer].    How Much is Too Much Alcohol? Drinking too much alcohol can cause injury, accidents, and health problems. These types of problems can include:   Car crashes.  Falls.  Family fighting (domestic violence).  Drowning.  Fights.  Injuries.  Burns.  Damage to certain organs.  Having a baby with birth defects. ONE DRINK CAN BE TOO MUCH WHEN YOU ARE:  Working.  Pregnant or breastfeeding.  Taking medicines. Ask your doctor.  Driving or planning to drive. If you or someone you know has a drinking problem, get help from a doctor.  Document Released: 05/07/2009 Document Revised: 10/03/2011 Document Reviewed: 05/07/2009 ExitCare Patient Information 2013 [employer].   Smoking Hazards Smoking cigarettes is extremely bad for your health. Tobacco smoke has over 200 known poisons in it. There are over 60 chemicals in tobacco smoke that cause cancer. Some of the chemicals found in cigarette smoke include:   Cyanide.  Benzene.  Formaldehyde.  Methanol (wood alcohol).  Acetylene (fuel used in welding torches).  Ammonia. Cigarette smoke also contains the poisonous gases nitrogen oxide and carbon monoxide.  Cigarette smokers have an increased risk of many serious medical problems and Smoking causes approximately:  90% of all lung cancer deaths in men.  80% of all lung cancer deaths in women.  90% of deaths from chronic obstructive lung disease. Compared with nonsmokers, smoking increases the risk of:  Coronary heart disease by 2 to 4 times.  Stroke by 2 to 4 times.  Men developing lung cancer by 23 times.  Women developing lung cancer by 13 times.  Dying from chronic obstructive lung diseases by 12 times.  . Smoking is the most preventable cause of death and disease in our society.  WHY IS SMOKING ADDICTIVE?  Nicotine is the chemical  agent in tobacco that is capable of causing addiction or dependence.  When you smoke and inhale, nicotine is absorbed rapidly into the bloodstream through your lungs. Nicotine absorbed through the lungs is capable of creating a powerful addiction. Both inhaled and non-inhaled nicotine may be addictive.  Addiction studies of cigarettes and spit tobacco show that addiction to nicotine occurs mainly during the teen years, when young people begin using tobacco products. WHAT ARE THE BENEFITS OF QUITTING?  There are many health benefits to quitting smoking.   Likelihood of developing cancer and heart disease decreases. Health improvements are seen almost immediately.  Blood pressure, pulse rate, and breathing patterns start returning to normal soon after quitting. QUITTING SMOKING   American Lung Association - 1-800-LUNGUSA  American Cancer Society - 1-800-ACS-2345 Document Released: 08/18/2004 Document Revised: 10/03/2011 Document Reviewed: 04/22/2009 ExitCare Patient Information 2013 [employer].   Stress Management Stress is a state of physical or mental tension that often results from changes in your life or normal routine. Some common causes of stress are:  Death of a loved one.  Injuries or severe illnesses.  Getting fired or changing jobs.  Moving into a new home. Other causes may be:  Sexual problems.  Business or financial losses.  Taking on a large debt.  Regular conflict with someone at home or at work.  Constant tiredness from lack of sleep. It is not just bad things that are stressful. It may be stressful to:  Win the lottery.  Get married.  Buy a new car. The amount of stress that can be easily tolerated varies from person to person. Changes generally cause stress, regardless of the types of change. Too much stress can affect your health. It may lead to physical or emotional problems. Too little stress (boredom) may also become stressful. SUGGESTIONS TO  REDUCE STRESS:  Talk things over with your family and friends. It often is helpful to share your concerns and worries. If you feel your problem is serious, you may want to get help from a professional counselor.  Consider your problems one at a time instead of lumping them all together. Trying to take care of everything at once may seem impossible. List all the things you need to do and then start with the most important one. Set a goal to accomplish 2 or 3 things each day. If you expect to do too many in a single day you will naturally fail, causing you to feel even more stressed.  Do not use alcohol or drugs to relieve stress. Although you may feel better for a short time, they do not remove the problems that caused the stress. They can also be habit forming.  Exercise regularly - at least 3 times per week. Physical exercise can help to relieve that "uptight" feeling and will relax you.  The shortest distance between despair and hope is often a good night's sleep.  Go to bed and get up on time allowing yourself time for appointments without being rushed.  Take a short "time-out" period from any stressful situation that occurs during the day. Close your eyes and take some deep breaths. Starting with the muscles in your face, tense them, hold it for a few seconds, then relax. Repeat this with the muscles in your neck, shoulders, hand, stomach, back and legs.  Take good care of yourself. Eat a balanced diet and get plenty of rest.  Schedule time for having fun. Take a break from your daily routine to relax. HOME CARE INSTRUCTIONS   Call if you feel overwhelmed by your problems and feel you can no longer manage them on your own.  Return immediately if you feel like hurting yourself or someone else. Document Released: 01/04/2001 Document Revised: 10/03/2011 Document Reviewed: 08/27/2007 ExitCare Patient Information 2013 [employer].   

## 2015-08-05 ENCOUNTER — Telehealth: Payer: Self-pay

## 2015-08-05 LAB — HEPATITIS C ANTIBODY: HCV Ab: NEGATIVE

## 2015-08-05 LAB — STD PANEL
HEP B S AG: NEGATIVE
HIV: NONREACTIVE

## 2015-08-05 LAB — HSV(HERPES SIMPLEX VRS) I + II AB-IGG
HSV 1 Glycoprotein G Ab, IgG: 4.73 IV — ABNORMAL HIGH
HSV 2 Glycoprotein G Ab, IgG: <0.1 IV

## 2015-08-05 LAB — IPS PAP TEST WITH REFLEX TO HPV

## 2015-08-05 NOTE — Telephone Encounter (Signed)
[email or web address]

## 2015-08-05 NOTE — Telephone Encounter (Signed)
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 She may have had oral history. Hep. B,C, HIV, RPR negative

## 2015-08-06 ENCOUNTER — Encounter: Payer: Self-pay | Admitting: Certified Nurse Midwife

## 2015-08-06 NOTE — Telephone Encounter (Signed)
Patient notified of results by Reina,CMA. See lab

## 2015-08-10 LAB — HEMOGLOBIN, FINGERSTICK: Hemoglobin, fingerstick: 12.5 g/dL (ref 12.0–16.0)

## 2015-08-12 ENCOUNTER — Telehealth: Payer: Self-pay | Admitting: Certified Nurse Midwife

## 2015-08-12 NOTE — Telephone Encounter (Signed)
-----   Message from [place], [place] sent at 08/07/2015  6:41 PM EST ----- Jackelyn Poling see her e-mail questions -I responded and told her you would contact her on Tuesday.  Since you know her better thought that would be the easiest solution - she is unable to come in for a consult visit.

## 2015-08-13 ENCOUNTER — Encounter: Payer: Self-pay | Admitting: Certified Nurse Midwife

## 2015-08-13 ENCOUNTER — Ambulatory Visit (INDEPENDENT_AMBULATORY_CARE_PROVIDER_SITE_OTHER): Payer: Commercial Managed Care - PPO | Admitting: Certified Nurse Midwife

## 2015-08-13 VITALS — BP 108/60 | HR 68 | Resp 16 | Ht 58.75 in | Wt 114.0 lb

## 2015-08-13 DIAGNOSIS — Z113 Encounter for screening for infections with a predominantly sexual mode of transmission: Secondary | ICD-10-CM

## 2015-08-13 NOTE — Telephone Encounter (Signed)
Unable to reach patient.

## 2015-08-13 NOTE — Patient Instructions (Signed)
Cold Sore °A cold sore (fever blister) is a skin infection caused by a certain type of germ (virus). They are small sores filled with fluid that dry up and heal within 2 weeks. Cold sores form inside of the mouth or on the lips, gums, and other parts of the body. Cold sores can be easily passed (contagious) to other people. This can happen through close personal contact, such as kissing or sharing a drinking glass. °HOME CARE °· Only take medicine as told by your doctor. Do not use aspirin. °· Use a cotton-tip swab to put creams or gels on your sores. °· Do not touch sores or pick scabs. Wash your hands often. Do not touch your eyes without washing your hands first. °· Avoid kissing, oral sex, and sharing personal items until the sores heal. °· Put an ice pack on your sores for 10-15 minutes to ease discomfort. °· Avoid hot, cold, or salty foods. Eat a soft, bland diet. Use a straw to drink if it helps lessen pain. °· Keep sores clean and dry. °· Avoid the sun and limit stress if these things cause you to have sores. Apply sunscreen on your lips if the sun causes cold sores. °GET HELP IF: °· You have a fever or lasting symptoms for more than 2-3 days. °· You have a fever and your symptoms suddenly get worse. °· You have yellow-white fluid (not clear) coming from the sores. °· You have redness that is spreading. °· You have pain or irritation in your eye. °· You get sores on your genitals. °· Your sores do not heal within 2 weeks. °· You have a tough time fighting off sickness and infections (weakened immune system). °· You get cold sores often. °MAKE SURE YOU:  °· Understand these instructions. °· Will watch your condition. °· Will get help right away if you are not doing well or get worse. °  °This information is not intended to replace advice given to you by your health care provider. Make sure you discuss any questions you have with your health care provider. °  °Document Released: 01/10/2012 Document Reviewed:  01/10/2012 °Elsevier Interactive Patient Education ©2016 Elsevier Inc. ° °

## 2015-08-13 NOTE — Progress Notes (Signed)
25 y.o. Single African American female G0P0000 here for for discussion regarding lab results of positive HSV 1. Patient has never had any outbreaks or history of oral blisters. No partner that she has been aware has had HSV1. Worried about possible effect on pregnancy in her future.  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 Reviewed pap smear findings negative but with BV noted, which she was treated for. Patient completed medication and reports no problems. Discussed etiology of HSV 1 and transmission source.ALso discussed could have had as a child and commonly called fever blisters. Patient does not recall having any but will check with mother. Discussed no concerns with becoming pregnant or with delivery unless patient has active outbreak of HSV. Discussed she may never have an outbreak, but just has developed antibodies from unknown exposure source. Questions addressed and  Patient felt much better about understanding the results. Would like to have redrawn for confirmation again in 2 months. Discussed do not feel necessary, but will be glad to reconfirm.  Labs HSV 1,2 future order placed for 2 months, patient will schedule prior to leaving.   RV as above, prn  Time spent with patient in discussion of STD screening 34 minutes

## 2015-08-17 NOTE — Progress Notes (Signed)
Reviewed personally.  M. Suzanne Kaitlyne Friedhoff, MD.  

## 2015-10-12 ENCOUNTER — Other Ambulatory Visit: Payer: Commercial Managed Care - PPO

## 2015-10-12 ENCOUNTER — Telehealth: Payer: Self-pay | Admitting: Certified Nurse Midwife

## 2015-10-12 NOTE — Telephone Encounter (Signed)
Left message on voicemail to call and reschedule missed lab appointment. °

## 2015-10-23 ENCOUNTER — Telehealth: Payer: Self-pay | Admitting: *Deleted

## 2015-10-23 NOTE — Telephone Encounter (Signed)
Patient wanting to get started on birth control. Best # to reach: [phone]

## 2015-10-23 NOTE — Telephone Encounter (Signed)
Last annual exam 08-04-15 with French Ana, CNM. Return call to patient. States she wants to start birth control without coming in for office visit. States she has been on it in the past although not with our office.  Advised since this is not something we have discussed with her, recommend OV and can also have follow-up lab tests that are now due. Patient declines appointment.  LMP 10/11/15.  Does not want Nuva Ring.  Unsure of name of previous pill.   Pt wants to speak directly to French Ana, CNM, and aggressive with this desire.  Pt dissatisfied that she has to wait for this answer.    Will route to provider for instructions.

## 2015-10-27 NOTE — Telephone Encounter (Signed)
Patient does need short OV to discuss best option for her and discuss risks/benefits. Also she had requested repeat labs.

## 2015-10-27 NOTE — Telephone Encounter (Signed)
Return call to patient, left message to call back. 

## 2015-11-03 NOTE — Telephone Encounter (Signed)
Spoke with patient. Advised of message as seen below from [place]. She is agreeable. Appointment scheduled for 11/05/2015 at 4 pm with Melvia Heaps CNM. She is agreeable to date and time.  Routing to provider for final review. Patient agreeable to disposition. Will close encounter.

## 2015-11-05 ENCOUNTER — Encounter: Payer: Self-pay | Admitting: Certified Nurse Midwife

## 2015-11-05 ENCOUNTER — Ambulatory Visit (INDEPENDENT_AMBULATORY_CARE_PROVIDER_SITE_OTHER): Payer: Commercial Managed Care - PPO | Admitting: Certified Nurse Midwife

## 2015-11-05 VITALS — BP 102/68 | HR 68 | Resp 16 | Ht 58.75 in | Wt 116.0 lb

## 2015-11-05 DIAGNOSIS — Z3009 Encounter for other general counseling and advice on contraception: Secondary | ICD-10-CM

## 2015-11-05 DIAGNOSIS — Z113 Encounter for screening for infections with a predominantly sexual mode of transmission: Secondary | ICD-10-CM | POA: Diagnosis not present

## 2015-11-05 MED ORDER — MEDROXYPROGESTERONE ACETATE 150 MG/ML IM SUSP: 150.0000 mg | INTRAMUSCULAR | Status: DC

## 2015-11-05 NOTE — Patient Instructions (Signed)

## 2015-11-05 NOTE — Progress Notes (Signed)
24 y.o. Single African American G0P0000here to discuss contraception option. Patient has used Nuvaring and OCP in past. Did not like Nuvaring use due to vaginal soreness and with OCP inconsistent use. Patient has read about Dpeo Provera and feels this is a good choice for her at this point in her life.Menses duration 4 days with moderate to light  Flow, 28 day cycle. No history of depression. No migraine history. No other concerns today. Currently using condoms for contraception. Patient also here for repeat HSV 1,  per her request due to history of and positive serology. No other concerns today.  O: Healthy female, WD WN Affect: normal orientation X 3    A: Contraception desired Depo Provera Previous OCP use with inconsistent use and Nuvaring use with vaginal irritation HSV positive serology only, no outbreaks  P: Discussed risks/benefits of Depo Provera and aware it is an injection every 3 months.Discussed bleeding profile at length of no period, spotting or amenorrhea or combination of all. Discussed concerns with weight gain and depression side effect and will be monitored closely with this. Questions addressed. Patient would like to start Depo Provera. Patient instructed she must call office when menses starts, must be given day 1-5 of cycle. Patient voiced understanding and pregnancy test will also be done to make sure no concerns. No further questions, handout given on Depo Provera.  Rx Depo Provera 150 mg IM every 3 months x 4.  Rv prn and as above.  28 minutes in consultation face to face with patient regarding contraception.

## 2015-11-06 LAB — HSV(HERPES SIMPLEX VRS) I + II AB-IGG: HSV 1 GLYCOPROTEIN G AB, IGG: 42.6 {index} — AB (ref <0.90)

## 2015-11-09 ENCOUNTER — Ambulatory Visit (INDEPENDENT_AMBULATORY_CARE_PROVIDER_SITE_OTHER): Payer: Commercial Managed Care - PPO | Admitting: *Deleted

## 2015-11-09 VITALS — BP 102/80 | HR 72 | Resp 18 | Ht 58.75 in | Wt 118.0 lb

## 2015-11-09 DIAGNOSIS — Z3042 Encounter for surveillance of injectable contraceptive: Secondary | ICD-10-CM | POA: Diagnosis not present

## 2015-11-09 LAB — POCT URINE PREGNANCY: Preg Test, Ur: NEGATIVE

## 2015-11-09 MED ORDER — MEDROXYPROGESTERONE ACETATE 150 MG/ML IM SUSP
150.0000 mg | Freq: Once | INTRAMUSCULAR | Status: AC
  Administered 2015-11-09: 150 mg via INTRAMUSCULAR

## 2015-11-09 NOTE — Progress Notes (Signed)
Patient is here for Depo Provera Injection Patient is within Depo Provera Calender Limits first injection today Next Depo Due between: 7/3 - 7/17 Last AEX: 08/04/15 DL AEX Scheduled: 08/04/16 DL  UPT : Neg  Pt tolerated Injection well. Waited in the office for 10 minutes after injection. No reaction noted. Patient was alerted and well at release time.  Patient is aware when next depo is due.   Routed to provider for review, encounter closed.

## 2015-11-10 ENCOUNTER — Telehealth: Payer: Self-pay

## 2015-11-10 ENCOUNTER — Ambulatory Visit: Payer: Commercial Managed Care - PPO

## 2015-11-10 NOTE — Telephone Encounter (Signed)
[email or web address]

## 2015-11-10 NOTE — Telephone Encounter (Signed)
Patient notified of results. See lab 

## 2015-11-10 NOTE — Telephone Encounter (Signed)
-----   Message from Regina Eck, CNM sent at 11/10/2015  7:35 AM EDT ----- Notify patient that HSV 1 is still showing positive  HSV 2 negative

## 2015-11-10 NOTE — Progress Notes (Signed)
Encounter reviewed Jill Jertson, MD   

## 2016-02-02 ENCOUNTER — Ambulatory Visit: Payer: Commercial Managed Care - PPO

## 2016-02-04 ENCOUNTER — Ambulatory Visit (INDEPENDENT_AMBULATORY_CARE_PROVIDER_SITE_OTHER): Payer: Commercial Managed Care - PPO

## 2016-02-04 VITALS — BP 108/70 | HR 84 | Wt 114.0 lb

## 2016-02-04 DIAGNOSIS — Z3042 Encounter for surveillance of injectable contraceptive: Secondary | ICD-10-CM

## 2016-02-04 MED ORDER — MEDROXYPROGESTERONE ACETATE 150 MG/ML IM SUSP
150.0000 mg | Freq: Once | INTRAMUSCULAR | Status: AC
  Administered 2016-02-04: 150 mg via INTRAMUSCULAR

## 2016-02-04 NOTE — Progress Notes (Signed)
Patient is here for Depo Provera Injection Patient is within Depo Provera Calender Limits yes Next Depo Due between: 9/28-10/12 Last AEX: 08/04/15 DL AEX Scheduled: 08/04/16   Patient is aware when next depo is due  Pt tolerated Injection well.  Routed to provider for review, encounter closed.

## 2016-02-05 ENCOUNTER — Telehealth: Payer: Self-pay | Admitting: Certified Nurse Midwife

## 2016-02-05 NOTE — Telephone Encounter (Signed)
Spoke with patient. Advised I have reviewed her symptoms with Melvia Heaps CNM. Per review ringing and pressure in ears is not a side effect of Depo. Advised patient this may be occuring due to pressure changes with the weather. Advised per Melvia Heaps CNM she may hold her nose and blow out to try to relieve the pressure in her ear. Should only due this once. Advised if symptoms persist she will need to be seen with a local urgent care or her PCP. She is agreeable.  Melvia Heaps CNM do you agree with recommendations?

## 2016-02-05 NOTE — Telephone Encounter (Signed)
Agree with plan 

## 2016-02-05 NOTE — Telephone Encounter (Signed)
Patient says her ear has been ringing since she got the depo shot on yesterday. Wondering if this could be a side effect.

## 2016-02-05 NOTE — Telephone Encounter (Signed)
Spoke with patient. Patient received her second Depo injection yesterday. Reports a couple of hours after receiving the injection she began to have ringing in her right ear. States ringing has continued into today and she is experiencing pressure in her ear. Denies any feelings of sinus pressure, nasal congestion, sore throat, or dizziness. Patient is asking if this is from her Depo injection. Advised this is not a common side effects of Depo. Advised if symptoms persist she will need to be seen with her PCP. Advised I will speak with Melvia Heaps CNM regarding symptoms and return call with further recommendations. She is agreeable.

## 2016-04-12 ENCOUNTER — Telehealth: Payer: Self-pay | Admitting: Certified Nurse Midwife

## 2016-04-12 ENCOUNTER — Ambulatory Visit: Payer: Commercial Managed Care - PPO

## 2016-04-12 NOTE — Telephone Encounter (Signed)
Left patient a message to call back to reschedule a future appointment that was cancelled for today for Depo. She is not due yet and needs to reschedule to after 9/28-10/12/17.

## 2016-05-03 ENCOUNTER — Ambulatory Visit: Payer: Commercial Managed Care - PPO

## 2016-05-03 ENCOUNTER — Telehealth: Payer: Self-pay | Admitting: Certified Nurse Midwife

## 2016-05-03 NOTE — Telephone Encounter (Signed)
Patient dnka her depo appointment this afternoon. I left a message for her to call and reschedule.

## 2016-05-03 NOTE — Progress Notes (Deleted)
Patient is here for Depo Provera Injection Patient is within Depo Provera Calender Limits 9/28-10/12 Next Depo Due between: 12/26-1/9 Last AEX: 08/04/15 DL AEX Scheduled: 08/04/16   Patient is aware when next depo is due  Pt tolerated Injection well RUOQ.  Routed to provider for review, encounter closed.

## 2016-08-04 ENCOUNTER — Encounter: Payer: Self-pay | Admitting: Certified Nurse Midwife

## 2016-08-04 ENCOUNTER — Ambulatory Visit (INDEPENDENT_AMBULATORY_CARE_PROVIDER_SITE_OTHER): Payer: Commercial Managed Care - PPO | Admitting: Certified Nurse Midwife

## 2016-08-04 VITALS — BP 100/62 | HR 64 | Resp 16 | Ht 58.75 in | Wt 119.0 lb

## 2016-08-04 DIAGNOSIS — Z0000 Encounter for general adult medical examination without abnormal findings: Secondary | ICD-10-CM

## 2016-08-04 DIAGNOSIS — Z3202 Encounter for pregnancy test, result negative: Secondary | ICD-10-CM

## 2016-08-04 DIAGNOSIS — Z01419 Encounter for gynecological examination (general) (routine) without abnormal findings: Secondary | ICD-10-CM

## 2016-08-04 DIAGNOSIS — N912 Amenorrhea, unspecified: Secondary | ICD-10-CM | POA: Diagnosis not present

## 2016-08-04 LAB — POCT URINALYSIS DIPSTICK
BILIRUBIN UA: NEGATIVE
Glucose, UA: NEGATIVE
Leukocytes, UA: NEGATIVE
NITRITE UA: NEGATIVE
PH UA: 5
Protein, UA: NEGATIVE
RBC UA: NEGATIVE
Urobilinogen, UA: NEGATIVE

## 2016-08-04 LAB — HEMOGLOBIN, FINGERSTICK: HEMOGLOBIN, FINGERSTICK: 12.6 g/dL (ref 12.0–15.0)

## 2016-08-04 LAB — POCT URINE PREGNANCY: PREG TEST UR: NEGATIVE

## 2016-08-04 NOTE — Progress Notes (Signed)
26 y.o. G0P0000 Single  African American Fe here for annual exam. No partner change, but still would like to do STD screening. Recently stopped Depo Provera and has not returned to regular cycles yet. Pregnancy would be welcome if occurs. Had very long period in 06/18/16 for > 12 days and feels like she may start soon with some cramping today.  No other health issues today.  Patient's last menstrual period was 06/18/2016 (exact date).          Sexually active: Yes.    The current method of family planning is none.    Exercising: Yes.    weight training Smoker:  no  Health Maintenance: Pap:  08-04-15 neg MMG:  none Colonoscopy:  none BMD:   none TDaP: pt believes may have had redone in 2013 Shingles: no Pneumonia: no Hep C and HIV: HIV neg 2017 Labs: poct upt-neg, poct urine-ketones tr, hgb-12.6 Self breast exam: done occ   reports that she has never smoked. She has never used smokeless tobacco. She reports that she drinks about 0.6 oz of alcohol per week . She reports that she does not use drugs.  Past Medical History:  Diagnosis Date  . STD (sexually transmitted disease)    HSV1    History reviewed. No pertinent surgical history.  Current Outpatient Prescriptions  Medication Sig Dispense Refill  . IBUPROFEN PO Take by mouth as needed.     No current facility-administered medications for this visit.     Family History  Problem Relation Age of Onset  . Hypertension Mother     ROS:  Pertinent items are noted in HPI.  Otherwise, a comprehensive ROS was negative.  Exam:   BP 100/62   Pulse 64   Resp 16   Ht 4' 10.75" (1.492 m)   Wt 119 lb (54 kg)   LMP 06/18/2016 (Exact Date)   BMI 24.24 kg/m  Height: 4' 10.75" (149.2 cm) Ht Readings from Last 3 Encounters:  08/04/16 4' 10.75" (1.492 m)  11/09/15 4' 10.75" (1.492 m)  11/05/15 4' 10.75" (1.492 m)    General appearance: alert, cooperative and appears stated age Head: Normocephalic, without obvious abnormality,  atraumatic Neck: no adenopathy, supple, symmetrical, trachea midline and thyroid normal to inspection and palpation Lungs: clear to auscultation bilaterally Breasts: normal appearance, no masses or tenderness, No nipple retraction or dimpling, No nipple discharge or bleeding, No axillary or supraclavicular adenopathy Heart: regular rate and rhythm Abdomen: soft, non-tender; no masses,  no organomegaly Extremities: extremities normal, atraumatic, no cyanosis or edema Skin: Skin color, texture, turgor normal. No rashes or lesions Lymph nodes: Cervical, supraclavicular, and axillary nodes normal. No abnormal inguinal nodes palpated Neurologic: Grossly normal   Pelvic: External genitalia:  no lesions              Urethra:  normal appearing urethra with no masses, tenderness or lesions              Bartholin's and Skene's: normal                 Vagina: normal appearing vagina with normal color and discharge, no lesions              Cervix: no cervical motion tenderness, no lesions and nulliparous appearance              Pap taken: No. Bimanual Exam:  Uterus:  normal size, contour, position, consistency, mobility, non-tender  Adnexa: normal adnexa and no mass, fullness, tenderness               Rectovaginal: Confirms               Anus:  normal appearance  POCT UPT negative  Chaperone present: yes  A:  Well Woman with normal exam  Contraception none desired  Screening labs  P:   Reviewed health and wellness pertinent to exam  Discussed starting on Prenatal vitamins with no contraception use and if pregnancy occurs. Patient will obtain OTC and start. Discussed if not menses in next month with negative pregnancy test needs to advise.  Labs;HIV,RPR, GC,Chlamydia, Affirm  Pap smear as above not taken   counseled on breast self exam, STD prevention, HIV risk factors and prevention, adequate intake of calcium and vitamin D, diet and exercise  return annually or prn  An After  Visit Summary was printed and given to the patient.

## 2016-08-04 NOTE — Patient Instructions (Signed)
General topics  Next pap or exam is  due in 1 year Take a Women's multivitamin Take 1200 mg. of calcium daily - prefer dietary If any concerns in interim to call back  Breast Self-Awareness Practicing breast self-awareness may pick up problems early, prevent significant medical complications, and possibly save your life. By practicing breast self-awareness, you can become familiar with how your breasts look and feel and if your breasts are changing. This allows you to notice changes early. It can also offer you some reassurance that your breast health is good. One way to learn what is normal for your breasts and whether your breasts are changing is to do a breast self-exam. If you find a lump or something that was not present in the past, it is best to contact your caregiver right away. Other findings that should be evaluated by your caregiver include nipple discharge, especially if it is bloody; skin changes or reddening; areas where the skin seems to be pulled in (retracted); or new lumps and bumps. Breast pain is seldom associated with cancer (malignancy), but should also be evaluated by a caregiver. BREAST SELF-EXAM The best time to examine your breasts is 5 7 days after your menstrual period is over.  ExitCare Patient Information 2013 [employer].   Exercise to Stay Healthy Exercise helps you become and stay healthy. EXERCISE IDEAS AND TIPS Choose exercises that:  You enjoy.  Fit into your day. You do not need to exercise really hard to be healthy. You can do exercises at a slow or medium level and stay healthy. You can:  Stretch before and after working out.  Try yoga, Pilates, or tai chi.  Lift weights.  Walk fast, swim, jog, run, climb stairs, bicycle, dance, or rollerskate.  Take aerobic classes. Exercises that burn about 150 calories:  Running 1  miles in 15 minutes.  Playing volleyball for 45 to 60 minutes.  Washing and waxing a car for 45 to 60  minutes.  Playing touch football for 45 minutes.  Walking 1  miles in 35 minutes.  Pushing a stroller 1  miles in 30 minutes.  Playing basketball for 30 minutes.  Raking leaves for 30 minutes.  Bicycling 5 miles in 30 minutes.  Walking 2 miles in 30 minutes.  Dancing for 30 minutes.  Shoveling snow for 15 minutes.  Swimming laps for 20 minutes.  Walking up stairs for 15 minutes.  Bicycling 4 miles in 15 minutes.  Gardening for 30 to 45 minutes.  Jumping rope for 15 minutes.  Washing windows or floors for 45 to 60 minutes. Document Released: 08/13/2010 Document Revised: 10/03/2011 Document Reviewed: 08/13/2010 ExitCare Patient Information 2013 [employer].   Other topics ( that may be useful information):    Sexually Transmitted Disease Sexually transmitted disease (STD) refers to any infection that is passed from person to person during sexual activity. This may happen by way of saliva, semen, blood, vaginal mucus, or urine. Common STDs include:  Gonorrhea.  Chlamydia.  Syphilis.  HIV/AIDS.  Genital herpes.  Hepatitis B and C.  Trichomonas.  Human papillomavirus (HPV).  Pubic lice. CAUSES  An STD may be spread by bacteria, virus, or parasite. A person can get an STD by:  Sexual intercourse with an infected person.  Sharing sex toys with an infected person.  Sharing needles with an infected person.  Having intimate contact with the genitals, mouth, or rectal areas of an infected person. SYMPTOMS  Some people may not have any symptoms, but   they can still pass the infection to others. Different STDs have different symptoms. Symptoms include:  Painful or bloody urination.  Pain in the pelvis, abdomen, vagina, anus, throat, or eyes.  Skin rash, itching, irritation, growths, or sores (lesions). These usually occur in the genital or anal area.  Abnormal vaginal discharge.  Penile discharge in men.  Soft, flesh-colored skin growths in the  genital or anal area.  Fever.  Pain or bleeding during sexual intercourse.  Swollen glands in the groin area.  Yellow skin and eyes (jaundice). This is seen with hepatitis. DIAGNOSIS  To make a diagnosis, your caregiver may:  Take a medical history.  Perform a physical exam.  Take a specimen (culture) to be examined.  Examine a sample of discharge under a microscope.  Perform blood test TREATMENT   Chlamydia, gonorrhea, trichomonas, and syphilis can be cured with antibiotic medicine.  Genital herpes, hepatitis, and HIV can be treated, but not cured, with prescribed medicines. The medicines will lessen the symptoms.  Genital warts from HPV can be treated with medicine or by freezing, burning (electrocautery), or surgery. Warts may come back.  HPV is a virus and cannot be cured with medicine or surgery.However, abnormal areas may be followed very closely by your caregiver and may be removed from the cervix, vagina, or vulva through office procedures or surgery. If your diagnosis is confirmed, your recent sexual partners need treatment. This is true even if they are symptom-free or have a negative culture or evaluation. They should not have sex until their caregiver says it is okay. HOME CARE INSTRUCTIONS  All sexual partners should be informed, tested, and treated for all STDs.  Take your antibiotics as directed. Finish them even if you start to feel better.  Only take over-the-counter or prescription medicines for pain, discomfort, or fever as directed by your caregiver.  Rest.  Eat a balanced diet and drink enough fluids to keep your urine clear or pale yellow.  Do not have sex until treatment is completed and you have followed up with your caregiver. STDs should be checked after treatment.  Keep all follow-up appointments, Pap tests, and blood tests as directed by your caregiver.  Only use latex condoms and water-soluble lubricants during sexual activity. Do not use  petroleum jelly or oils.  Avoid alcohol and illegal drugs.  Get vaccinated for HPV and hepatitis. If you have not received these vaccines in the past, talk to your caregiver about whether one or both might be right for you.  Avoid risky sex practices that can break the skin. The only way to avoid getting an STD is to avoid all sexual activity.Latex condoms and dental dams (for oral sex) will help lessen the risk of getting an STD, but will not completely eliminate the risk. SEEK MEDICAL CARE IF:   You have a fever.  You have any new or worsening symptoms. Document Released: 10/01/2002 Document Revised: 10/03/2011 Document Reviewed: 10/08/2010 [facility] Patient Information 2013 [employer].    Domestic Abuse You are being battered or abused if someone close to you hits, pushes, or physically hurts you in any way. You also are being abused if you are forced into activities. You are being sexually abused if you are forced to have sexual contact of any kind. You are being emotionally abused if you are made to feel worthless or if you are constantly threatened. It is important to remember that help is available. No one has the right to abuse you. PREVENTION OF FURTHER  ABUSE  Learn the warning signs of danger. This varies with situations but may include: the use of alcohol, threats, isolation from friends and family, or forced sexual contact. Leave if you feel that violence is going to occur.  If you are attacked or beaten, report it to the police so the abuse is documented. You do not have to press charges. The police can protect you while you or the attackers are leaving. Get the officer's name and badge number and a copy of the report.  Find someone you can trust and tell them what is happening to you: your caregiver, a nurse, clergy member, close friend or family member. Feeling ashamed is natural, but remember that you have done nothing wrong. No one deserves abuse. Document Released:  07/08/2000 Document Revised: 10/03/2011 Document Reviewed: 09/16/2010 ExitCare Patient Information 2013 [employer].    How Much is Too Much Alcohol? Drinking too much alcohol can cause injury, accidents, and health problems. These types of problems can include:   Car crashes.  Falls.  Family fighting (domestic violence).  Drowning.  Fights.  Injuries.  Burns.  Damage to certain organs.  Having a baby with birth defects. ONE DRINK CAN BE TOO MUCH WHEN YOU ARE:  Working.  Pregnant or breastfeeding.  Taking medicines. Ask your doctor.  Driving or planning to drive. If you or someone you know has a drinking problem, get help from a doctor.  Document Released: 05/07/2009 Document Revised: 10/03/2011 Document Reviewed: 05/07/2009 ExitCare Patient Information 2013 [employer].   Smoking Hazards Smoking cigarettes is extremely bad for your health. Tobacco smoke has over 200 known poisons in it. There are over 60 chemicals in tobacco smoke that cause cancer. Some of the chemicals found in cigarette smoke include:   Cyanide.  Benzene.  Formaldehyde.  Methanol (wood alcohol).  Acetylene (fuel used in welding torches).  Ammonia. Cigarette smoke also contains the poisonous gases nitrogen oxide and carbon monoxide.  Cigarette smokers have an increased risk of many serious medical problems and Smoking causes approximately:  90% of all lung cancer deaths in men.  80% of all lung cancer deaths in women.  90% of deaths from chronic obstructive lung disease. Compared with nonsmokers, smoking increases the risk of:  Coronary heart disease by 2 to 4 times.  Stroke by 2 to 4 times.  Men developing lung cancer by 23 times.  Women developing lung cancer by 13 times.  Dying from chronic obstructive lung diseases by 12 times.  . Smoking is the most preventable cause of death and disease in our society.  WHY IS SMOKING ADDICTIVE?  Nicotine is the chemical  agent in tobacco that is capable of causing addiction or dependence.  When you smoke and inhale, nicotine is absorbed rapidly into the bloodstream through your lungs. Nicotine absorbed through the lungs is capable of creating a powerful addiction. Both inhaled and non-inhaled nicotine may be addictive.  Addiction studies of cigarettes and spit tobacco show that addiction to nicotine occurs mainly during the teen years, when young people begin using tobacco products. WHAT ARE THE BENEFITS OF QUITTING?  There are many health benefits to quitting smoking.   Likelihood of developing cancer and heart disease decreases. Health improvements are seen almost immediately.  Blood pressure, pulse rate, and breathing patterns start returning to normal soon after quitting. QUITTING SMOKING   American Lung Association - 1-800-LUNGUSA  American Cancer Society - 1-800-ACS-2345 Document Released: 08/18/2004 Document Revised: 10/03/2011 Document Reviewed: 04/22/2009 ExitCare Patient Information 2013 [employer].   Stress Management Stress is a state of physical or mental tension that often results from changes in your life or normal routine. Some common causes of stress are:  Death of a loved one.  Injuries or severe illnesses.  Getting fired or changing jobs.  Moving into a new home. Other causes may be:  Sexual problems.  Business or financial losses.  Taking on a large debt.  Regular conflict with someone at home or at work.  Constant tiredness from lack of sleep. It is not just bad things that are stressful. It may be stressful to:  Win the lottery.  Get married.  Buy a new car. The amount of stress that can be easily tolerated varies from person to person. Changes generally cause stress, regardless of the types of change. Too much stress can affect your health. It may lead to physical or emotional problems. Too little stress (boredom) may also become stressful. SUGGESTIONS TO  REDUCE STRESS:  Talk things over with your family and friends. It often is helpful to share your concerns and worries. If you feel your problem is serious, you may want to get help from a professional counselor.  Consider your problems one at a time instead of lumping them all together. Trying to take care of everything at once may seem impossible. List all the things you need to do and then start with the most important one. Set a goal to accomplish 2 or 3 things each day. If you expect to do too many in a single day you will naturally fail, causing you to feel even more stressed.  Do not use alcohol or drugs to relieve stress. Although you may feel better for a short time, they do not remove the problems that caused the stress. They can also be habit forming.  Exercise regularly - at least 3 times per week. Physical exercise can help to relieve that "uptight" feeling and will relax you.  The shortest distance between despair and hope is often a good night's sleep.  Go to bed and get up on time allowing yourself time for appointments without being rushed.  Take a short "time-out" period from any stressful situation that occurs during the day. Close your eyes and take some deep breaths. Starting with the muscles in your face, tense them, hold it for a few seconds, then relax. Repeat this with the muscles in your neck, shoulders, hand, stomach, back and legs.  Take good care of yourself. Eat a balanced diet and get plenty of rest.  Schedule time for having fun. Take a break from your daily routine to relax. HOME CARE INSTRUCTIONS   Call if you feel overwhelmed by your problems and feel you can no longer manage them on your own.  Return immediately if you feel like hurting yourself or someone else. Document Released: 01/04/2001 Document Revised: 10/03/2011 Document Reviewed: 08/27/2007 ExitCare Patient Information 2013 [employer].   

## 2016-08-05 LAB — RPR

## 2016-08-05 LAB — HIV ANTIBODY (ROUTINE TESTING W REFLEX): HIV 1&2 Ab, 4th Generation: NONREACTIVE

## 2016-08-05 LAB — GC/CHLAMYDIA PROBE AMP
CT Probe RNA: NOT DETECTED
GC Probe RNA: NOT DETECTED

## 2016-08-05 LAB — WET PREP BY MOLECULAR PROBE
Candida species: NEGATIVE
GARDNERELLA VAGINALIS: POSITIVE — AB
TRICHOMONAS VAG: NEGATIVE

## 2016-08-06 NOTE — Progress Notes (Signed)
Encounter reviewed Zeenat Jeanbaptiste, MD   

## 2016-10-20 ENCOUNTER — Ambulatory Visit: Payer: Commercial Managed Care - PPO | Admitting: Certified Nurse Midwife

## 2016-10-20 ENCOUNTER — Telehealth: Payer: Self-pay | Admitting: Certified Nurse Midwife

## 2016-10-20 NOTE — Telephone Encounter (Signed)
Patient canceled her appointment today for birth control consult. Patient had made the appointment yesterday afternoon at 4:12 pm so was unable to give 24 hours notice this morning. Patient rescheduled to 10/24/16 @4 :00 pm.

## 2016-10-25 ENCOUNTER — Encounter: Payer: Self-pay | Admitting: Certified Nurse Midwife

## 2016-10-25 ENCOUNTER — Ambulatory Visit (INDEPENDENT_AMBULATORY_CARE_PROVIDER_SITE_OTHER): Payer: Commercial Managed Care - PPO | Admitting: Certified Nurse Midwife

## 2016-10-25 VITALS — BP 102/70 | HR 76 | Resp 16 | Ht 58.75 in | Wt 107.0 lb

## 2016-10-25 DIAGNOSIS — Z30011 Encounter for initial prescription of contraceptive pills: Secondary | ICD-10-CM

## 2016-10-25 DIAGNOSIS — Z3009 Encounter for other general counseling and advice on contraception: Secondary | ICD-10-CM | POA: Diagnosis not present

## 2016-10-25 MED ORDER — NORETHIN ACE-ETH ESTRAD-FE 1-20 MG-MCG PO TABS: 1.0000 | ORAL_TABLET | Freq: Every day | ORAL | 6 refills | Status: DC

## 2016-10-25 NOTE — Patient Instructions (Signed)
Oral Contraception Use Oral contraceptive pills (OCPs) are medicines taken to prevent pregnancy. OCPs work by preventing the ovaries from releasing eggs. The hormones in OCPs also cause the cervical mucus to thicken, preventing the sperm from entering the uterus. The hormones also cause the uterine lining to become thin, not allowing a fertilized egg to attach to the inside of the uterus. OCPs are highly effective when taken exactly as prescribed. However, OCPs do not prevent sexually transmitted diseases (STDs). Safe sex practices, such as using condoms along with an OCP, can help prevent STDs. Before taking OCPs, you may have a physical exam and Pap test. Your health care provider may also order blood tests if necessary. Your health care provider will make sure you are a good candidate for oral contraception. Discuss with your health care provider the possible side effects of the OCP you may be prescribed. When starting an OCP, it can take 2 to 3 months for the body to adjust to the changes in hormone levels in your body. How to take oral contraceptive pills Your health care provider may advise you on how to start taking the first cycle of OCPs. Otherwise, you can:  Start on day 1 of your menstrual period. You will not need any backup contraceptive protection with this start time.  Start on the first Sunday after your menstrual period or the day you get your prescription. In these cases, you will need to use backup contraceptive protection for the first week.  Start the pill at any time of your cycle. If you take the pill within 5 days of the start of your period, you are protected against pregnancy right away. In this case, you will not need a backup form of birth control. If you start at any other time of your menstrual cycle, you will need to use another form of birth control for 7 days. If your OCP is the type called a minipill, it will protect you from pregnancy after taking it for 2 days (48  hours).  After you have started taking OCPs:  If you forget to take 1 pill, take it as soon as you remember. Take the next pill at the regular time.  If you miss 2 or more pills, call your health care provider because different pills have different instructions for missed doses. Use backup birth control until your next menstrual period starts.  If you use a 28-day pack that contains inactive pills and you miss 1 of the last 7 pills (pills with no hormones), it will not matter. Throw away the rest of the non-hormone pills and start a new pill pack.  No matter which day you start the OCP, you will always start a new pack on that same day of the week. Have an extra pack of OCPs and a backup contraceptive method available in case you miss some pills or lose your OCP pack. Follow these instructions at home:  Do not smoke.  Always use a condom to protect against STDs. OCPs do not protect against STDs.  Use a calendar to mark your menstrual period days.  Read the information and directions that came with your OCP. Talk to your health care provider if you have questions. Contact a health care provider if:  You develop nausea and vomiting.  You have abnormal vaginal discharge or bleeding.  You develop a rash.  You miss your menstrual period.  You are losing your hair.  You need treatment for mood swings or depression.  You   get dizzy when taking the OCP.  You develop acne from taking the OCP.  You become pregnant. Get help right away if:  You develop chest pain.  You develop shortness of breath.  You have an uncontrolled or severe headache.  You develop numbness or slurred speech.  You develop visual problems.  You develop pain, redness, and swelling in the legs. This information is not intended to replace advice given to you by your health care provider. Make sure you discuss any questions you have with your health care provider. Document Released: 06/30/2011 Document  Revised: 12/17/2015 Document Reviewed: 12/30/2012 Elsevier Interactive Patient Education  2017 Elsevier Inc.  

## 2016-10-26 MED ORDER — METRONIDAZOLE 500 MG PO TABS: 500.0000 mg | ORAL_TABLET | Freq: Two times a day (BID) | ORAL | 0 refills | Status: DC

## 2016-10-26 NOTE — Telephone Encounter (Signed)
Spoke with patient. Patient states that she forgot to tell Melvia Heaps CNM at her OV yesterday that she started her menses on 10/20/2016 and would like to start on Flagyl for treatment of BV. Rx for Flagyl 500 mg bid x 7 days #14 0RF sent to pharmacy on file. Avoid alcohol during treatment and 24 hours after completing medication. Don't mix with alcohol if mixed can cause severe nausea, vomiting and abdominal cramping.   Notes Recorded by Susy Manor, CMA on 08/05/2016 at 2:15 PM EST Patient notified of results as written by provider. Pt to callback when she starts her cycle. ------  Notes Recorded by Regina Eck, CNM on 08/05/2016 at 12:59 PM EST Notify patient that RPR, HIV are negative Gc,Chlamydia pending Wet prep positive for BV needs Flagyl she had not had period, so can not treat until period( no contraception) she will need to call Trichomonas and Yeast negative  Routing to provider for final review. Patient agreeable to disposition. Will close encounter.

## 2016-10-26 NOTE — Telephone Encounter (Signed)
Patient was told to call when her cycle started to have a prescription sent to the pharmacy on file.

## 2016-10-26 NOTE — Progress Notes (Signed)
26 y.o. Single Asian G0P0000here for discussion of contraception options. Has been on Depo Provera and has decided she would not continue, due to period profile. Has used OCP before and missed pills ( in her teens), has used Nuvaring but did not to insert in vagina. Does not like the idea of IUD or Nexplanon due to insertion in her body at this point. Would consider the patch and OCP. Menses duration 5 days with moderate to light  flow. Cramping has returned with her period now, but OTC Advil works well. Non smoker, no DVT history or Migraine headache with aura history. Last aex 08/04/16, all WNL.   ROS: pertinent to HPI  O: Healthy female, WD WN, normal weight Affect: normal orientation X 3    A: Contraception change desired on current menses now Previous history of Nuvaring, OCP, Depo Provera use without problems OCP, Orth Evra patch options, not interested in IUD or Nexplanon AEX current  P: Discussed risks/benefits warning signs and side effects of OCP and Patch. Discussed bleeding profile expectations and cycle management of dysmenorrhea possible with use. Discussed application of Patch, adhesive use and removal. Questions addressed. Discussed importance of consistent use of pills for good cycle control and pregnancy prevention. Patient feels she can take pills on regular basis, as she has set schedule and much more responsible. Rx Loestrin 1/20 Fe see order with instructions Printed instructions reviewed and given to patient, can start OCP today. Questions addressed.   Rv 3 months for medication evaluation, prn  20 minutes spent with patient in counseling regarding contraception options

## 2016-10-28 NOTE — Progress Notes (Signed)
Encounter reviewed. Chart reviewed, no contraindications to OCP's noted. Sumner Boast, MD

## 2016-11-24 ENCOUNTER — Telehealth: Payer: Self-pay | Admitting: Certified Nurse Midwife

## 2016-11-24 NOTE — Telephone Encounter (Signed)
Spoke with patient. Patient requesting RX for treatment of BV. Patient states she was treated with flagyl po on 4/4, she felt like symptoms were resolving and have restarted again today. Patient states she has not had intercourse or partners since last treatment. Patient reports she has recurring BV, discharge with odor, creamy white. Denies urinary complaints, burning or itching. LMP end of April. Recommended OV for further evaluation, patient declined. Patient states can't take off of work first 2 weeks of month, was hoping something could be called in. Advised patient would review with Melvia Heaps, CNM and return call with recommendations, patient is agreeable.  Melvia Heaps, CNM -please advise?

## 2016-11-24 NOTE — Telephone Encounter (Signed)
Patient returning your call.

## 2016-11-24 NOTE — Telephone Encounter (Signed)
If she is given treatment for BV and does not resolve, then she is being treated incorrectly, because may not be BV.Jeanne Harper She needs OV.

## 2016-11-24 NOTE — Telephone Encounter (Signed)
Spoke with patient, advised as seen below per Melvia Heaps, CNM. Patient declines to schedule OV at this time d/t work schedule, will return call at a later date. Patient verbalizes understanding.  Routing to provider for final review. Patient is agreeable to disposition. Will close encounter.

## 2016-11-24 NOTE — Telephone Encounter (Signed)
Left message to call Kerrington Sova at [phone].  

## 2016-11-24 NOTE — Telephone Encounter (Signed)
Patient thinks her bacterial vaginitis has come back and wants to know if we can call in something to the pharmacy for her.

## 2016-11-25 ENCOUNTER — Ambulatory Visit (INDEPENDENT_AMBULATORY_CARE_PROVIDER_SITE_OTHER): Payer: Commercial Managed Care - PPO | Admitting: Certified Nurse Midwife

## 2016-11-25 ENCOUNTER — Encounter: Payer: Self-pay | Admitting: Certified Nurse Midwife

## 2016-11-25 VITALS — BP 100/68 | HR 60 | Resp 16 | Ht 58.75 in | Wt 101.0 lb

## 2016-11-25 DIAGNOSIS — N760 Acute vaginitis: Secondary | ICD-10-CM | POA: Diagnosis not present

## 2016-11-25 DIAGNOSIS — B9689 Other specified bacterial agents as the cause of diseases classified elsewhere: Secondary | ICD-10-CM | POA: Diagnosis not present

## 2016-11-25 MED ORDER — METRONIDAZOLE 0.75 % VA GEL: 1.0000 | Freq: Two times a day (BID) | VAGINAL | 0 refills | Status: DC

## 2016-11-25 NOTE — Progress Notes (Signed)
26 y.o. Single African American female G0P0000 here with complaint of vaginal symptoms of itching, burning, and increase discharge. Describes discharge as white watery with slight odor. Onset of symptoms 1 day ago. Denies new personal products or vaginal dryness. No STD concerns. Urinary symptoms none . Contraception is OCP, has started  ROS Pertinent to HPI  O:Healthy female WDWN Affect: normal, orientation x 3  Exam: Skin warm and dry Abdomen: soft, non tender  inguinal Lymph nodes: no enlargement or tenderness Pelvic exam: External genital: normal female, no lesions, or exudate BUS: negative Vagina: white watery odorous discharge noted. Ph: 5.0  ,Wet prep taken, Affirm taken Cervix: normal, non tender, no CMT Uterus: normal, non tender Adnexa:normal, non tender, no masses or fullness noted   Wet Prep results: KOH, Saline + clue   A:Normal pelvic exam BV   P:Discussed findings of normal pelvic exam and BV finding and etiology. Discussed Aveeno or baking soda sitz bath for comfort and odor management Avoid moist clothes for extended period of time. If working out in gym clothes or swim suits for long periods of time change underwear or bottoms of swimsuit if possible. Coconut Oil use for skin protection prior to activity. Rx Metrogel see order, with instructions. Avoid other medications use if other symptoms. Call for advice.  Rv prn

## 2016-11-25 NOTE — Patient Instructions (Signed)

## 2017-01-20 ENCOUNTER — Encounter: Payer: Self-pay | Admitting: Certified Nurse Midwife

## 2017-01-20 ENCOUNTER — Ambulatory Visit: Payer: Commercial Managed Care - PPO | Admitting: Certified Nurse Midwife

## 2017-01-20 ENCOUNTER — Telehealth: Payer: Self-pay | Admitting: Certified Nurse Midwife

## 2017-01-20 NOTE — Telephone Encounter (Signed)
Joy please follow up on this

## 2017-01-20 NOTE — Progress Notes (Deleted)
26 y.o. {MARITAL STATUS:22092} {Race/ethnicity:17218} G0P0000here for evaluation of ********** initiated on {DATE MONTH DAY FDVO:451460479} for ***********. Menses duration {NUMBER 1-10:22536} days with       flow. Patient taking medication as prescribed. Denies missed pills, headaches, nausea, DVT warning signs or symptoms,  breakthrough bleeding, or other changes.   Keeping menses calendar. No other health issues today  O: Healthy female, WD WN Affect: normal orientation X 3    A: History of ************** with **********working well  P: Continue ********* as prescribed or Rx    *** minutes spent with patient with >50% of time spent in face to face counseling.  RV

## 2017-01-20 NOTE — Telephone Encounter (Signed)
Left message on voicemail for patient to reschedule 3 month reck appointment.

## 2017-01-23 NOTE — Telephone Encounter (Signed)
Left detailed message for patient to call to schedule 66mth follow up appointment.

## 2017-01-24 NOTE — Telephone Encounter (Signed)
No appt scheduled. Okay to close encounter?

## 2017-01-24 NOTE — Telephone Encounter (Signed)
Ok to close encounter. 

## 2017-05-11 ENCOUNTER — Other Ambulatory Visit: Payer: Self-pay | Admitting: Certified Nurse Midwife

## 2017-05-11 DIAGNOSIS — Z30011 Encounter for initial prescription of contraceptive pills: Secondary | ICD-10-CM

## 2017-05-11 NOTE — Telephone Encounter (Signed)
Patient calling for a refill on birth control to be sent to [facility] on [address].

## 2017-05-11 NOTE — Telephone Encounter (Signed)
Medication refill request: Junel  Last AEX:  08/04/16 DL Next AEX: 08/08/17 DL Last MMG (if hormonal medication request): none Refill authorized: 10/25/16 #[id number]/6R. Today please advise.

## 2017-06-20 ENCOUNTER — Encounter: Payer: Self-pay | Admitting: Certified Nurse Midwife

## 2017-08-08 ENCOUNTER — Ambulatory Visit: Payer: Commercial Managed Care - PPO | Admitting: Certified Nurse Midwife

## 2017-09-08 ENCOUNTER — Encounter: Payer: Self-pay | Admitting: Certified Nurse Midwife

## 2017-09-08 ENCOUNTER — Other Ambulatory Visit: Payer: Self-pay

## 2017-09-08 ENCOUNTER — Ambulatory Visit (INDEPENDENT_AMBULATORY_CARE_PROVIDER_SITE_OTHER): Payer: Commercial Managed Care - PPO | Admitting: Certified Nurse Midwife

## 2017-09-08 VITALS — BP 100/64 | HR 84 | Resp 20 | Ht 58.5 in | Wt 116.4 lb

## 2017-09-08 DIAGNOSIS — Z113 Encounter for screening for infections with a predominantly sexual mode of transmission: Secondary | ICD-10-CM | POA: Diagnosis not present

## 2017-09-08 DIAGNOSIS — Z30011 Encounter for initial prescription of contraceptive pills: Secondary | ICD-10-CM

## 2017-09-08 DIAGNOSIS — Z01419 Encounter for gynecological examination (general) (routine) without abnormal findings: Secondary | ICD-10-CM

## 2017-09-08 MED ORDER — NORETHIN ACE-ETH ESTRAD-FE 1-20 MG-MCG PO TABS: 1.0000 | ORAL_TABLET | Freq: Every day | ORAL | 12 refills | Status: DC

## 2017-09-08 NOTE — Progress Notes (Signed)
27 y.o. G0P0000 Single  African American Fe here for annual exam.Periods normal, not heavy. Contraception working well. No missed pills or periods.No STD concerns but desires testing. No health issues today. Going on her first cruise soon!  Patient's last menstrual period was 08/26/2017 (exact date).          Sexually active: Yes.    The current method of family planning is OCP (estrogen/progesterone).    Exercising: Yes.    strength training Smoker:  no  Health Maintenance: Pap:  08-04-15 neg History of Abnormal Pap: no MMG:  none Self Breast exams: occ Colonoscopy:  none BMD:   none TDaP:  2013 Shingles: no Pneumonia: no Hep C and HIV: HIV neg 2018, hep c neg 2017 Labs: yes   reports that  has never smoked. she has never used smokeless tobacco. She reports that she drinks alcohol. She reports that she does not use drugs.  Past Medical History:  Diagnosis Date  . STD (sexually transmitted disease)    HSV1    History reviewed. No pertinent surgical history.  Current Outpatient Medications  Medication Sig Dispense Refill  . IBUPROFEN PO Take by mouth as needed.    Lenda Kelp FE 1/20 1-20 MG-MCG tablet TAKE 1 TABLET BY MOUTH ONCE DAILY 84 tablet 1   No current facility-administered medications for this visit.     Family History  Problem Relation Age of Onset  . Hypertension Mother     ROS:  Pertinent items are noted in HPI.  Otherwise, a comprehensive ROS was negative.  Exam:   BP 100/64 (BP Location: Right Arm, Patient Position: Sitting, Cuff Size: Normal)   Pulse 84   Resp 20   Ht 4' 10.5" (1.486 m)   Wt 116 lb 6.4 oz (52.8 kg)   LMP 08/26/2017 (Exact Date)   BMI 23.91 kg/m  Height: 4' 10.5" (148.6 cm) Ht Readings from Last 3 Encounters:  09/08/17 4' 10.5" (1.486 m)  11/25/16 4' 10.75" (1.492 m)  10/25/16 4' 10.75" (1.492 m)    General appearance: alert, cooperative and appears stated age Head: Normocephalic, without obvious abnormality, atraumatic Neck: no  adenopathy, supple, symmetrical, trachea midline and thyroid normal to inspection and palpation Lungs: clear to auscultation bilaterally Breasts: normal appearance, no masses or tenderness, No nipple retraction or dimpling, No nipple discharge or bleeding, No axillary or supraclavicular adenopathy Heart: regular rate and rhythm Abdomen: soft, non-tender; no masses,  no organomegaly Extremities: extremities normal, atraumatic, no cyanosis or edema Skin: Skin color, texture, turgor normal. No rashes or lesions Lymph nodes: Cervical, supraclavicular, and axillary nodes normal. No abnormal inguinal nodes palpated Neurologic: Grossly normal   Pelvic: External genitalia:  no lesions              Urethra:  normal appearing urethra with no masses, tenderness or lesions              Bartholin's and Skene's: normal                 Vagina: normal appearing vagina with normal color and discharge, no lesions              Cervix: no cervical motion tenderness, no lesions and nulliparous appearance              Pap taken: No. Bimanual Exam:  Uterus:  normal size, contour, position, consistency, mobility, non-tender and anteverted              Adnexa: normal adnexa and no  mass, fullness, tenderness               Rectovaginal: Confirms               Anus:  normal appearance  Chaperone present: yes  A:  Well Woman with normal exam  Contraception desires OCP  STD screening  P:   Reviewed health and wellness pertinent to exam  Discussed risks/benefits/warning signs with OCP, desires continuance  Rx Junel 1/20 Fe see order with instructions  Pap smear: no  counseled on breast self exam, STD prevention, HIV risk factors and prevention, use and side effects of OCP's, adequate intake of calcium and vitamin D, diet and exercise  return annually or prn  An After Visit Summary was printed and given to the patient.

## 2017-09-08 NOTE — Patient Instructions (Signed)
General topics  Next pap or exam is  due in 1 year Take a Women's multivitamin Take 1200 mg. of calcium daily - prefer dietary If any concerns in interim to call back  Breast Self-Awareness Practicing breast self-awareness may pick up problems early, prevent significant medical complications, and possibly save your life. By practicing breast self-awareness, you can become familiar with how your breasts look and feel and if your breasts are changing. This allows you to notice changes early. It can also offer you some reassurance that your breast health is good. One way to learn what is normal for your breasts and whether your breasts are changing is to do a breast self-exam. If you find a lump or something that was not present in the past, it is best to contact your caregiver right away. Other findings that should be evaluated by your caregiver include nipple discharge, especially if it is bloody; skin changes or reddening; areas where the skin seems to be pulled in (retracted); or new lumps and bumps. Breast pain is seldom associated with cancer (malignancy), but should also be evaluated by a caregiver. BREAST SELF-EXAM The best time to examine your breasts is 5 7 days after your menstrual period is over.  ExitCare Patient Information 2013 [employer].   Exercise to Stay Healthy Exercise helps you become and stay healthy. EXERCISE IDEAS AND TIPS Choose exercises that:  You enjoy.  Fit into your day. You do not need to exercise really hard to be healthy. You can do exercises at a slow or medium level and stay healthy. You can:  Stretch before and after working out.  Try yoga, Pilates, or tai chi.  Lift weights.  Walk fast, swim, jog, run, climb stairs, bicycle, dance, or rollerskate.  Take aerobic classes. Exercises that burn about 150 calories:  Running 1  miles in 15 minutes.  Playing volleyball for 45 to 60 minutes.  Washing and waxing a car for 45 to 60  minutes.  Playing touch football for 45 minutes.  Walking 1  miles in 35 minutes.  Pushing a stroller 1  miles in 30 minutes.  Playing basketball for 30 minutes.  Raking leaves for 30 minutes.  Bicycling 5 miles in 30 minutes.  Walking 2 miles in 30 minutes.  Dancing for 30 minutes.  Shoveling snow for 15 minutes.  Swimming laps for 20 minutes.  Walking up stairs for 15 minutes.  Bicycling 4 miles in 15 minutes.  Gardening for 30 to 45 minutes.  Jumping rope for 15 minutes.  Washing windows or floors for 45 to 60 minutes. Document Released: 08/13/2010 Document Revised: 10/03/2011 Document Reviewed: 08/13/2010 ExitCare Patient Information 2013 [employer].   Other topics ( that may be useful information):    Sexually Transmitted Disease Sexually transmitted disease (STD) refers to any infection that is passed from person to person during sexual activity. This may happen by way of saliva, semen, blood, vaginal mucus, or urine. Common STDs include:  Gonorrhea.  Chlamydia.  Syphilis.  HIV/AIDS.  Genital herpes.  Hepatitis B and C.  Trichomonas.  Human papillomavirus (HPV).  Pubic lice. CAUSES  An STD may be spread by bacteria, virus, or parasite. A person can get an STD by:  Sexual intercourse with an infected person.  Sharing sex toys with an infected person.  Sharing needles with an infected person.  Having intimate contact with the genitals, mouth, or rectal areas of an infected person. SYMPTOMS  Some people may not have any symptoms, but   they can still pass the infection to others. Different STDs have different symptoms. Symptoms include:  Painful or bloody urination.  Pain in the pelvis, abdomen, vagina, anus, throat, or eyes.  Skin rash, itching, irritation, growths, or sores (lesions). These usually occur in the genital or anal area.  Abnormal vaginal discharge.  Penile discharge in men.  Soft, flesh-colored skin growths in the  genital or anal area.  Fever.  Pain or bleeding during sexual intercourse.  Swollen glands in the groin area.  Yellow skin and eyes (jaundice). This is seen with hepatitis. DIAGNOSIS  To make a diagnosis, your caregiver may:  Take a medical history.  Perform a physical exam.  Take a specimen (culture) to be examined.  Examine a sample of discharge under a microscope.  Perform blood test TREATMENT   Chlamydia, gonorrhea, trichomonas, and syphilis can be cured with antibiotic medicine.  Genital herpes, hepatitis, and HIV can be treated, but not cured, with prescribed medicines. The medicines will lessen the symptoms.  Genital warts from HPV can be treated with medicine or by freezing, burning (electrocautery), or surgery. Warts may come back.  HPV is a virus and cannot be cured with medicine or surgery.However, abnormal areas may be followed very closely by your caregiver and may be removed from the cervix, vagina, or vulva through office procedures or surgery. If your diagnosis is confirmed, your recent sexual partners need treatment. This is true even if they are symptom-free or have a negative culture or evaluation. They should not have sex until their caregiver says it is okay. HOME CARE INSTRUCTIONS  All sexual partners should be informed, tested, and treated for all STDs.  Take your antibiotics as directed. Finish them even if you start to feel better.  Only take over-the-counter or prescription medicines for pain, discomfort, or fever as directed by your caregiver.  Rest.  Eat a balanced diet and drink enough fluids to keep your urine clear or pale yellow.  Do not have sex until treatment is completed and you have followed up with your caregiver. STDs should be checked after treatment.  Keep all follow-up appointments, Pap tests, and blood tests as directed by your caregiver.  Only use latex condoms and water-soluble lubricants during sexual activity. Do not use  petroleum jelly or oils.  Avoid alcohol and illegal drugs.  Get vaccinated for HPV and hepatitis. If you have not received these vaccines in the past, talk to your caregiver about whether one or both might be right for you.  Avoid risky sex practices that can break the skin. The only way to avoid getting an STD is to avoid all sexual activity.Latex condoms and dental dams (for oral sex) will help lessen the risk of getting an STD, but will not completely eliminate the risk. SEEK MEDICAL CARE IF:   You have a fever.  You have any new or worsening symptoms. Document Released: 10/01/2002 Document Revised: 10/03/2011 Document Reviewed: 10/08/2010 [facility] Patient Information 2013 [employer].    Domestic Abuse You are being battered or abused if someone close to you hits, pushes, or physically hurts you in any way. You also are being abused if you are forced into activities. You are being sexually abused if you are forced to have sexual contact of any kind. You are being emotionally abused if you are made to feel worthless or if you are constantly threatened. It is important to remember that help is available. No one has the right to abuse you. PREVENTION OF FURTHER  ABUSE  Learn the warning signs of danger. This varies with situations but may include: the use of alcohol, threats, isolation from friends and family, or forced sexual contact. Leave if you feel that violence is going to occur.  If you are attacked or beaten, report it to the police so the abuse is documented. You do not have to press charges. The police can protect you while you or the attackers are leaving. Get the officer's name and badge number and a copy of the report.  Find someone you can trust and tell them what is happening to you: your caregiver, a nurse, clergy member, close friend or family member. Feeling ashamed is natural, but remember that you have done nothing wrong. No one deserves abuse. Document Released:  07/08/2000 Document Revised: 10/03/2011 Document Reviewed: 09/16/2010 ExitCare Patient Information 2013 [employer].    How Much is Too Much Alcohol? Drinking too much alcohol can cause injury, accidents, and health problems. These types of problems can include:   Car crashes.  Falls.  Family fighting (domestic violence).  Drowning.  Fights.  Injuries.  Burns.  Damage to certain organs.  Having a baby with birth defects. ONE DRINK CAN BE TOO MUCH WHEN YOU ARE:  Working.  Pregnant or breastfeeding.  Taking medicines. Ask your doctor.  Driving or planning to drive. If you or someone you know has a drinking problem, get help from a doctor.  Document Released: 05/07/2009 Document Revised: 10/03/2011 Document Reviewed: 05/07/2009 ExitCare Patient Information 2013 [employer].   Smoking Hazards Smoking cigarettes is extremely bad for your health. Tobacco smoke has over 200 known poisons in it. There are over 60 chemicals in tobacco smoke that cause cancer. Some of the chemicals found in cigarette smoke include:   Cyanide.  Benzene.  Formaldehyde.  Methanol (wood alcohol).  Acetylene (fuel used in welding torches).  Ammonia. Cigarette smoke also contains the poisonous gases nitrogen oxide and carbon monoxide.  Cigarette smokers have an increased risk of many serious medical problems and Smoking causes approximately:  90% of all lung cancer deaths in men.  80% of all lung cancer deaths in women.  90% of deaths from chronic obstructive lung disease. Compared with nonsmokers, smoking increases the risk of:  Coronary heart disease by 2 to 4 times.  Stroke by 2 to 4 times.  Men developing lung cancer by 23 times.  Women developing lung cancer by 13 times.  Dying from chronic obstructive lung diseases by 12 times.  . Smoking is the most preventable cause of death and disease in our society.  WHY IS SMOKING ADDICTIVE?  Nicotine is the chemical  agent in tobacco that is capable of causing addiction or dependence.  When you smoke and inhale, nicotine is absorbed rapidly into the bloodstream through your lungs. Nicotine absorbed through the lungs is capable of creating a powerful addiction. Both inhaled and non-inhaled nicotine may be addictive.  Addiction studies of cigarettes and spit tobacco show that addiction to nicotine occurs mainly during the teen years, when young people begin using tobacco products. WHAT ARE THE BENEFITS OF QUITTING?  There are many health benefits to quitting smoking.   Likelihood of developing cancer and heart disease decreases. Health improvements are seen almost immediately.  Blood pressure, pulse rate, and breathing patterns start returning to normal soon after quitting. QUITTING SMOKING   American Lung Association - 1-800-LUNGUSA  American Cancer Society - 1-800-ACS-2345 Document Released: 08/18/2004 Document Revised: 10/03/2011 Document Reviewed: 04/22/2009 ExitCare Patient Information 2013 [employer].   Stress Management Stress is a state of physical or mental tension that often results from changes in your life or normal routine. Some common causes of stress are:  Death of a loved one.  Injuries or severe illnesses.  Getting fired or changing jobs.  Moving into a new home. Other causes may be:  Sexual problems.  Business or financial losses.  Taking on a large debt.  Regular conflict with someone at home or at work.  Constant tiredness from lack of sleep. It is not just bad things that are stressful. It may be stressful to:  Win the lottery.  Get married.  Buy a new car. The amount of stress that can be easily tolerated varies from person to person. Changes generally cause stress, regardless of the types of change. Too much stress can affect your health. It may lead to physical or emotional problems. Too little stress (boredom) may also become stressful. SUGGESTIONS TO  REDUCE STRESS:  Talk things over with your family and friends. It often is helpful to share your concerns and worries. If you feel your problem is serious, you may want to get help from a professional counselor.  Consider your problems one at a time instead of lumping them all together. Trying to take care of everything at once may seem impossible. List all the things you need to do and then start with the most important one. Set a goal to accomplish 2 or 3 things each day. If you expect to do too many in a single day you will naturally fail, causing you to feel even more stressed.  Do not use alcohol or drugs to relieve stress. Although you may feel better for a short time, they do not remove the problems that caused the stress. They can also be habit forming.  Exercise regularly - at least 3 times per week. Physical exercise can help to relieve that "uptight" feeling and will relax you.  The shortest distance between despair and hope is often a good night's sleep.  Go to bed and get up on time allowing yourself time for appointments without being rushed.  Take a short "time-out" period from any stressful situation that occurs during the day. Close your eyes and take some deep breaths. Starting with the muscles in your face, tense them, hold it for a few seconds, then relax. Repeat this with the muscles in your neck, shoulders, hand, stomach, back and legs.  Take good care of yourself. Eat a balanced diet and get plenty of rest.  Schedule time for having fun. Take a break from your daily routine to relax. HOME CARE INSTRUCTIONS   Call if you feel overwhelmed by your problems and feel you can no longer manage them on your own.  Return immediately if you feel like hurting yourself or someone else. Document Released: 01/04/2001 Document Revised: 10/03/2011 Document Reviewed: 08/27/2007 ExitCare Patient Information 2013 [employer].   

## 2017-09-09 LAB — HEP, RPR, HIV PANEL
HEP B S AG: NEGATIVE
HIV SCREEN 4TH GENERATION: NONREACTIVE
RPR: NONREACTIVE

## 2017-09-09 LAB — VAGINITIS/VAGINOSIS, DNA PROBE
Candida Species: NEGATIVE
GARDNERELLA VAGINALIS: POSITIVE — AB
Trichomonas vaginosis: NEGATIVE

## 2017-09-09 LAB — CHLAMYDIA/GC NAA, CONFIRMATION
CHLAMYDIA TRACHOMATIS, NAA: NEGATIVE
Neisseria gonorrhoeae, NAA: NEGATIVE

## 2017-09-09 LAB — HEPATITIS C ANTIBODY: Hep C Virus Ab: <0.1 s/co ratio (ref 0.0–0.9)

## 2017-09-10 ENCOUNTER — Other Ambulatory Visit: Payer: Self-pay | Admitting: Certified Nurse Midwife

## 2017-09-10 DIAGNOSIS — N760 Acute vaginitis: Principal | ICD-10-CM

## 2017-09-10 DIAGNOSIS — B9689 Other specified bacterial agents as the cause of diseases classified elsewhere: Secondary | ICD-10-CM

## 2017-09-10 MED ORDER — METRONIDAZOLE 0.75 % VA GEL: 1.0000 | Freq: Two times a day (BID) | VAGINAL | 0 refills | Status: DC

## 2017-09-10 NOTE — Progress Notes (Signed)
Patient sent My Chart notification of lab results and also Rx sent

## 2018-08-31 ENCOUNTER — Telehealth: Payer: Self-pay | Admitting: Certified Nurse Midwife

## 2018-08-31 ENCOUNTER — Ambulatory Visit: Payer: Commercial Managed Care - PPO | Admitting: Obstetrics and Gynecology

## 2018-08-31 ENCOUNTER — Encounter: Payer: Self-pay | Admitting: Obstetrics and Gynecology

## 2018-08-31 ENCOUNTER — Other Ambulatory Visit: Payer: Self-pay

## 2018-08-31 VITALS — BP 100/64 | HR 84 | Temp 99.3°F | Ht 58.5 in | Wt 127.2 lb

## 2018-08-31 DIAGNOSIS — N926 Irregular menstruation, unspecified: Secondary | ICD-10-CM | POA: Diagnosis not present

## 2018-08-31 DIAGNOSIS — N760 Acute vaginitis: Secondary | ICD-10-CM | POA: Diagnosis not present

## 2018-08-31 DIAGNOSIS — R102 Pelvic and perineal pain: Secondary | ICD-10-CM | POA: Diagnosis not present

## 2018-08-31 DIAGNOSIS — Z113 Encounter for screening for infections with a predominantly sexual mode of transmission: Secondary | ICD-10-CM | POA: Diagnosis not present

## 2018-08-31 LAB — POCT URINE PREGNANCY: Preg Test, Ur: NEGATIVE

## 2018-08-31 NOTE — Progress Notes (Signed)
GYNECOLOGY  VISIT   HPI: 28 y.o.   Single  African American  female   [place] with Patient's last menstrual period was 07/30/2018 (exact date).   here for vaginal pain for 1 week. She does have labial sores but thinks this is related to shaving. Denies any urinary symptoms or fever.  Having random sharp pains in the vagina for 1 - 2 weeks.  No vaginal discharge. No vaginal bleeding.  Now using a pad.  Using powder on her vulva each the am.  Using oatmeal soap externally.   No change in partner. Same partner for 6 years.   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  Never had an outbreak.   She states she has a history of BV and uses Metrogel yearly per patient. Usually she has an odor, which she is not preceiving.   Denies dysuria.  Normal bowel function.   Increasing her work outs.   Temp:99.3   UPT - neg  GYNECOLOGIC HISTORY: Patient's last menstrual period was 07/30/2018 (exact date). Contraception: none Menopausal hormone therapy:  none Last mammogram:  n/a Last pap smear: 08-04-15 Neg        OB History    Gravida  0   Para  0   Term  0   Preterm  0   AB  0   Living  0     SAB  0   TAB  0   Ectopic  0   Multiple  0   Live Births                 There are no active problems to display for this patient.   Past Medical History:  Diagnosis Date  . STD (sexually transmitted disease)    HSV1    No past surgical history on file.  Current Outpatient Medications  Medication Sig Dispense Refill  . IBUPROFEN PO Take by mouth as needed.     No current facility-administered medications for this visit.      ALLERGIES: Other  Family History  Problem Relation Age of Onset  . Hypertension Mother     Social History   Socioeconomic History  . Marital status: Single    Spouse name: Not on file  . Number of children: Not on file  . Years of education: Not on file  . Highest education level: Not on file  Occupational History  . Not  on file  Social Needs  . Financial resource strain: Not on file  . Food insecurity:    Worry: Not on file    Inability: Not on file  . Transportation needs:    Medical: Not on file    Non-medical: Not on file  Tobacco Use  . Smoking status: Never Smoker  . Smokeless tobacco: Never Used  Substance and Sexual Activity  . Alcohol use: Yes    Comment: once every 2 mths  . Drug use: No  . Sexual activity: Yes    Partners: Male    Birth control/protection: Pill  Lifestyle  . Physical activity:    Days per week: Not on file    Minutes per session: Not on file  . Stress: Not on file  Relationships  . Social connections:    Talks on phone: Not on file    Gets together: Not on file    Attends religious service: Not on file    Active member of club or organization: Not on file    Attends meetings of  clubs or organizations: Not on file    Relationship status: Not on file  . Intimate partner violence:    Fear of current or ex partner: Not on file    Emotionally abused: Not on file    Physically abused: Not on file    Forced sexual activity: Not on file  Other Topics Concern  . Not on file  Social History Narrative  . Not on file    Review of Systems  All other systems reviewed and are negative.   PHYSICAL EXAMINATION:    BP 100/64 (BP Location: Right Arm, Patient Position: Sitting, Cuff Size: Normal)   Pulse 84   Temp 99.3 F (37.4 C) (Oral)   Ht 4' 10.5" (1.486 m)   Wt 127 lb 3.2 oz (57.7 kg)   LMP 07/30/2018 (Exact Date)   BMI 26.13 kg/m     General appearance: alert, cooperative and appears stated age   Abdomen: soft, non-tender, no masses,  no organomegaly   Pelvic: External genitalia:   Two mildly inflamed follicles of the mons and left labia majora.              Urethra:  normal appearing urethra with no masses, tenderness or lesions              Bartholins and Skenes: normal                 Vagina: normal appearing vagina with normal color and white  discharge coating the vagina and the cervix, no lesions              Cervix: no lesions.  No CMT.                Bimanual Exam:  Uterus:  normal size, contour, position, consistency, mobility, non-tender              Adnexa: no mass, fullness, tenderness           Chaperone was present for exam.  ASSESSMENT  Vaginitis.  Hx BV.  STD screening.  Vaginal pain.  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  No evidence of outbreak.  Missed menses.  UPT negative.    PLAN  Nuswab vaginitis and STD screening.  Treatment to follow.  I recommended against using talcum powder on her vulva.  She may use corn starch instead.  Keep appointment for annual exam with Evalee Mutton.    An After Visit Summary was printed and given to the patient.  __15____ minutes face to face time of which over 50% was spent in counseling.

## 2018-08-31 NOTE — Telephone Encounter (Signed)
Spoke with patient. Patient reports increasing pelvic pain for the last 2 wks, 6/10 on pain scale with intermittent sharp pains. describes soreness in vaginal canal. No pain with intercourse. Denies N/V, fever/chills,  vaginal d/c, odor, irregular bleeding or urinary symptoms. No longer on OCP, stopped 03/2018.   Last AEX 09/08/17, next AEX 09/11/18 with Melvia Heaps, CNM.  Recommended OV with MD for further evaluation, OV scheduled for today at 10:45am with Dr. Quincy Simmonds.   Routing to provider for final review. Patient is agreeable to disposition. Will close encounter.  Cc: Melvia Heaps, CNM

## 2018-08-31 NOTE — Telephone Encounter (Signed)
Patient is having pelvic pain. °

## 2018-09-04 ENCOUNTER — Other Ambulatory Visit: Payer: Self-pay | Admitting: *Deleted

## 2018-09-04 LAB — NUSWAB VAGINITIS PLUS (VG+)
ATOPOBIUM VAGINAE: HIGH {score} — AB
BVAB 2: HIGH Score — AB
CANDIDA ALBICANS, NAA: NEGATIVE
CANDIDA GLABRATA, NAA: NEGATIVE
CHLAMYDIA TRACHOMATIS, NAA: NEGATIVE
Megasphaera 1: HIGH Score — AB
NEISSERIA GONORRHOEAE, NAA: NEGATIVE
TRICH VAG BY NAA: NEGATIVE

## 2018-09-04 MED ORDER — METRONIDAZOLE 0.75 % VA GEL: 1.0000 | Freq: Every day | VAGINAL | 0 refills | Status: AC

## 2018-09-11 ENCOUNTER — Ambulatory Visit: Payer: Commercial Managed Care - PPO | Admitting: Certified Nurse Midwife

## 2018-10-02 ENCOUNTER — Ambulatory Visit: Payer: Commercial Managed Care - PPO | Admitting: Certified Nurse Midwife

## 2018-10-17 ENCOUNTER — Ambulatory Visit: Payer: Commercial Managed Care - PPO | Admitting: Certified Nurse Midwife

## 2019-10-07 ENCOUNTER — Encounter: Payer: Self-pay | Admitting: Certified Nurse Midwife

## 2019-10-09 ENCOUNTER — Encounter: Payer: Self-pay | Admitting: Certified Nurse Midwife

## 2019-12-30 ENCOUNTER — Telehealth: Payer: Self-pay

## 2019-12-30 NOTE — Telephone Encounter (Signed)
Patient called in regards to wanting to schedule a hormone panel.

## 2019-12-30 NOTE — Telephone Encounter (Signed)
AEX 08/2017, next scheduled for 07/2020 with Dr Quincy Simmonds.  Last OV 08/2018 +BV and treated LMP 12/09/19  Spoke with pt. Pt states feeling tired and sluggish, occasional night sweats  x 2-3 months.Pt denies fever, chills, vaginal bleeding or discharge, odor. Pt states not currently on contraception. Pt states is SA. Has not taken UPT. Advised to take one. Pt agreeable.  Pt advised to have OV for further evaluation with possible labs. Pt agreeable. Pt scheduled with Dr Quincy Simmonds on 01/09/20 at 9 am per pt's request of date and time. Pt verbalized understanding of date and time. CPS neg. Advised will review with Dr Quincy Simmonds and return call if any further recommendations. Routing to Dr Quincy Simmonds for review.  Encounter closed.

## 2020-01-07 NOTE — Progress Notes (Deleted)
GYNECOLOGY  VISIT   HPI: 29 y.o.   Single  African American  female   G0P0000 with No LMP recorded.   here for     GYNECOLOGIC HISTORY: No LMP recorded. Contraception:  ***None Menopausal hormone therapy:  n/a Last mammogram:  n/a Last pap smear:  08-04-15 Neg        OB History    Gravida  0   Para  0   Term  0   Preterm  0   AB  0   Living  0     SAB  0   TAB  0   Ectopic  0   Multiple  0   Live Births                 There are no problems to display for this patient.   Past Medical History:  Diagnosis Date  . STD (sexually transmitted disease)    HSV1    No past surgical history on file.  Current Outpatient Medications  Medication Sig Dispense Refill  . IBUPROFEN PO Take by mouth as needed.     No current facility-administered medications for this visit.     ALLERGIES: Other  Family History  Problem Relation Age of Onset  . Hypertension Mother     Social History   Socioeconomic History  . Marital status: Single    Spouse name: Not on file  . Number of children: Not on file  . Years of education: Not on file  . Highest education level: Not on file  Occupational History  . Not on file  Tobacco Use  . Smoking status: Never Smoker  . Smokeless tobacco: Never Used  Substance and Sexual Activity  . Alcohol use: Yes    Comment: once every 2 mths  . Drug use: No  . Sexual activity: Yes    Partners: Male    Birth control/protection: Pill  Other Topics Concern  . Not on file  Social History Narrative  . Not on file   Social Determinants of Health   Financial Resource Strain:   . Difficulty of Paying Living Expenses:   Food Insecurity:   . Worried About Charity fundraiser in the Last Year:   . Arboriculturist in the Last Year:   Transportation Needs:   . Film/video editor (Medical):   Marland Kitchen Lack of Transportation (Non-Medical):   Physical Activity:   . Days of Exercise per Week:   . Minutes of Exercise per Session:    Stress:   . Feeling of Stress :   Social Connections:   . Frequency of Communication with Friends and Family:   . Frequency of Social Gatherings with Friends and Family:   . Attends Religious Services:   . Active Member of Clubs or Organizations:   . Attends Archivist Meetings:   Marland Kitchen Marital Status:   Intimate Partner Violence:   . Fear of Current or Ex-Partner:   . Emotionally Abused:   Marland Kitchen Physically Abused:   . Sexually Abused:     Review of Systems  PHYSICAL EXAMINATION:    There were no vitals taken for this visit.    General appearance: alert, cooperative and appears stated age Head: Normocephalic, without obvious abnormality, atraumatic Neck: no adenopathy, supple, symmetrical, trachea midline and thyroid normal to inspection and palpation Lungs: clear to auscultation bilaterally Breasts: normal appearance, no masses or tenderness, No nipple retraction or dimpling, No nipple discharge or bleeding, No axillary  or supraclavicular adenopathy Heart: regular rate and rhythm Abdomen: soft, non-tender, no masses,  no organomegaly Extremities: extremities normal, atraumatic, no cyanosis or edema Skin: Skin color, texture, turgor normal. No rashes or lesions Lymph nodes: Cervical, supraclavicular, and axillary nodes normal. No abnormal inguinal nodes palpated Neurologic: Grossly normal  Pelvic: External genitalia:  no lesions              Urethra:  normal appearing urethra with no masses, tenderness or lesions              Bartholins and Skenes: normal                 Vagina: normal appearing vagina with normal color and discharge, no lesions              Cervix: no lesions                Bimanual Exam:  Uterus:  normal size, contour, position, consistency, mobility, non-tender              Adnexa: no mass, fullness, tenderness              Rectal exam: {yes no:314532}.  Confirms.              Anus:  normal sphincter tone, no lesions  Chaperone was present for  exam.  ASSESSMENT     PLAN     An After Visit Summary was printed and given to the patient.  ______ minutes face to face time of which over 50% was spent in counseling.

## 2020-01-09 ENCOUNTER — Ambulatory Visit: Payer: Self-pay | Admitting: Obstetrics and Gynecology

## 2020-04-28 ENCOUNTER — Telehealth: Payer: Self-pay

## 2020-04-28 NOTE — Telephone Encounter (Signed)
Spoke with patient.  LMP 03/26/20 UPT x 3, most recent on 04/28/20, all positive.  Requesting OV for pregnancy confirmation.  Patient denies vaginal bleeding, N/V, fever/chills.  Patient reports intermittent cramping low in pelvis, started 1 wk ago. Currently 5/10 on pain scale.  Last AEX 09/08/17 w/ Melvia Heaps, CNM, Next AEX 08/05/20 w/ Dr. Quincy Simmonds.  Advised patient I will need to review schedule with provider and return call, patient agreeable.  Hx HSV1  Dr. Quincy Simmonds -please review and advise.

## 2020-04-28 NOTE — Telephone Encounter (Signed)
Please schedule office visit with me for 12:45 pm tomorrow? She can go to the M[facility] Admissions Units at [facility] if she is having significant pain or any bleeding.

## 2020-04-28 NOTE — Telephone Encounter (Signed)
Spoke with patient. OV scheduled for 04/29/20 at 12:45pm with Dr. Quincy Simmonds.  MAU precautions reviewed for significant pain and/or vaginal bleeding. Patient verbalizes understanding and is agreeable.   Encounter closed.

## 2020-04-28 NOTE — Telephone Encounter (Signed)
Patient would like appointment for pregnancy confirmation.

## 2020-04-29 ENCOUNTER — Encounter: Payer: Self-pay | Admitting: Obstetrics and Gynecology

## 2020-04-29 ENCOUNTER — Ambulatory Visit: Payer: Commercial Managed Care - PPO | Admitting: Obstetrics and Gynecology

## 2020-04-29 ENCOUNTER — Other Ambulatory Visit: Payer: Self-pay

## 2020-04-29 VITALS — BP 102/70 | HR 70 | Ht 58.5 in | Wt 129.0 lb

## 2020-04-29 DIAGNOSIS — N926 Irregular menstruation, unspecified: Secondary | ICD-10-CM

## 2020-04-29 LAB — POCT URINE PREGNANCY: Preg Test, Ur: POSITIVE — AB

## 2020-04-29 LAB — BETA HCG QUANT (REF LAB): hCG Quant: 611 m[IU]/mL

## 2020-04-29 NOTE — Progress Notes (Signed)
GYNECOLOGY  VISIT   HPI: 29 y.o.   married  [place] American  female   [place] with Patient's last menstrual period was 03/26/2020 (exact date).   here for pregnancy confirmation.  Patient complaining of low pelvic cramping off and on for the past week. She has had 4 positive home UPTs.  Cramping is mild like a pressure in the middle. Thought it was constipation.  Last BM was this am but was not complete.  No bleeding or spotting.   No nausea.  Tender and heavy breasts.  Randomly falling asleep.   UPT: Positive  GYNECOLOGIC HISTORY: Patient's last menstrual period was 03/26/2020 (exact date). Contraception:  none Menopausal hormone therapy:  n/a Last mammogram:  n/a Last pap smear: 08-04-15 Neg        OB History    Gravida  0   Para  0   Term  0   Preterm  0   AB  0   Living  0     SAB  0   TAB  0   Ectopic  0   Multiple  0   Live Births                 There are no problems to display for this patient.   Past Medical History:  Diagnosis Date  . STD (sexually transmitted disease)    HSV1    History reviewed. No pertinent surgical history.  Current Outpatient Medications  Medication Sig Dispense Refill  . IBUPROFEN PO Take by mouth as needed.     No current facility-administered medications for this visit.     ALLERGIES: Other  Family History  Problem Relation Age of Onset  . Hypertension Mother     Social History   Socioeconomic History  . Marital status: Single    Spouse name: Not on file  . Number of children: Not on file  . Years of education: Not on file  . Highest education level: Not on file  Occupational History  . Not on file  Tobacco Use  . Smoking status: Never Smoker  . Smokeless tobacco: Never Used  Substance and Sexual Activity  . Alcohol use: Yes    Comment: once every 2 mths  . Drug use: No  . Sexual activity: Yes    Partners: Male    Birth control/protection: Pill  Other Topics Concern  . Not on file   Social History Narrative  . Not on file   Social Determinants of Health   Financial Resource Strain:   . Difficulty of Paying Living Expenses: Not on file  Food Insecurity:   . Worried About Charity fundraiser in the Last Year: Not on file  . Ran Out of Food in the Last Year: Not on file  Transportation Needs:   . Lack of Transportation (Medical): Not on file  . Lack of Transportation (Non-Medical): Not on file  Physical Activity:   . Days of Exercise per Week: Not on file  . Minutes of Exercise per Session: Not on file  Stress:   . Feeling of Stress : Not on file  Social Connections:   . Frequency of Communication with Friends and Family: Not on file  . Frequency of Social Gatherings with Friends and Family: Not on file  . Attends Religious Services: Not on file  . Active Member of Clubs or Organizations: Not on file  . Attends Archivist Meetings: Not on file  . Marital Status: Not on file  Intimate Partner Violence:   . Fear of Current or Ex-Partner: Not on file  . Emotionally Abused: Not on file  . Physically Abused: Not on file  . Sexually Abused: Not on file    Review of Systems  Genitourinary: Positive for pelvic pain.  All other systems reviewed and are negative.   PHYSICAL EXAMINATION:    BP 102/70   Pulse 70   Ht 4' 10.5" (1.486 m)   Wt 129 lb (58.5 kg)   LMP 03/26/2020 (Exact Date)   BMI 26.50 kg/m     General appearance: alert, cooperative and appears stated age   Pelvic: External genitalia:  no lesions              Urethra:  normal appearing urethra with no masses, tenderness or lesions              Bartholins and Skenes: normal                 Vagina: normal appearing vagina with normal color and discharge, no lesions              Cervix: no lesions                Bimanual Exam:  Uterus:  normal size, contour, position, consistency, mobility, non-tender              Adnexa: no mass, fullness, tenderness          Chaperone was present  for exam.  ASSESSMENT  4+[redacted] weeks pregnant.  Pelvic cramping.   PLAN  Will check hCG now.  OB ultrasound.  We discussed potential ectopic pregnancy. Start PNV.  Stop ibuprofen.  Avoid exposures.  Recommended What to Expect When Expecting.

## 2020-04-30 ENCOUNTER — Ambulatory Visit (INDEPENDENT_AMBULATORY_CARE_PROVIDER_SITE_OTHER): Payer: Commercial Managed Care - PPO

## 2020-04-30 ENCOUNTER — Ambulatory Visit: Payer: Commercial Managed Care - PPO | Admitting: Obstetrics and Gynecology

## 2020-04-30 ENCOUNTER — Encounter: Payer: Self-pay | Admitting: Obstetrics and Gynecology

## 2020-04-30 VITALS — BP 98/58 | HR 70 | Ht 58.5 in | Wt 129.0 lb

## 2020-04-30 DIAGNOSIS — N926 Irregular menstruation, unspecified: Secondary | ICD-10-CM | POA: Diagnosis not present

## 2020-04-30 DIAGNOSIS — D219 Benign neoplasm of connective and other soft tissue, unspecified: Secondary | ICD-10-CM | POA: Diagnosis not present

## 2020-04-30 DIAGNOSIS — Z3490 Encounter for supervision of normal pregnancy, unspecified, unspecified trimester: Secondary | ICD-10-CM

## 2020-04-30 DIAGNOSIS — Z3201 Encounter for pregnancy test, result positive: Secondary | ICD-10-CM | POA: Diagnosis not present

## 2020-04-30 NOTE — Patient Instructions (Signed)

## 2020-04-30 NOTE — Progress Notes (Signed)
GYNECOLOGY  VISIT   HPI: 29 y.o.   Single  African American  female   [place] with Patient's last menstrual period was 03/26/2020 (exact date).   here for pelvic ultrasound.   Thinks she is constipated and feels pressure in her pelvis.  hCG 611.   GYNECOLOGIC HISTORY: Patient's last menstrual period was 03/26/2020 (exact date). Contraception: none Menopausal hormone therapy:  n/a Last mammogram:  n/a Last pap smear:   08-04-15 Neg        OB History    Gravida  0   Para  0   Term  0   Preterm  0   AB  0   Living  0     SAB  0   TAB  0   Ectopic  0   Multiple  0   Live Births                 There are no problems to display for this patient.   Past Medical History:  Diagnosis Date  . STD (sexually transmitted disease)    HSV1    History reviewed. No pertinent surgical history.  No current outpatient medications on file.   No current facility-administered medications for this visit.     ALLERGIES: Other  Family History  Problem Relation Age of Onset  . Hypertension Mother     Social History   Socioeconomic History  . Marital status: Single    Spouse name: Not on file  . Number of children: Not on file  . Years of education: Not on file  . Highest education level: Not on file  Occupational History  . Not on file  Tobacco Use  . Smoking status: Never Smoker  . Smokeless tobacco: Never Used  Substance and Sexual Activity  . Alcohol use: Yes    Comment: once every 2 mths  . Drug use: No  . Sexual activity: Yes    Partners: Male    Birth control/protection: Pill  Other Topics Concern  . Not on file  Social History Narrative  . Not on file   Social Determinants of Health   Financial Resource Strain:   . Difficulty of Paying Living Expenses: Not on file  Food Insecurity:   . Worried About Charity fundraiser in the Last Year: Not on file  . Ran Out of Food in the Last Year: Not on file  Transportation Needs:   . Lack of  Transportation (Medical): Not on file  . Lack of Transportation (Non-Medical): Not on file  Physical Activity:   . Days of Exercise per Week: Not on file  . Minutes of Exercise per Session: Not on file  Stress:   . Feeling of Stress : Not on file  Social Connections:   . Frequency of Communication with Friends and Family: Not on file  . Frequency of Social Gatherings with Friends and Family: Not on file  . Attends Religious Services: Not on file  . Active Member of Clubs or Organizations: Not on file  . Attends Archivist Meetings: Not on file  . Marital Status: Not on file  Intimate Partner Violence:   . Fear of Current or Ex-Partner: Not on file  . Emotionally Abused: Not on file  . Physically Abused: Not on file  . Sexually Abused: Not on file    Review of Systems  All other systems reviewed and are negative.   PHYSICAL EXAMINATION:    BP (!) 98/58   Pulse  70   Ht 4' 10.5" (1.486 m)   Wt 129 lb (58.5 kg)   LMP 03/26/2020 (Exact Date)   BMI 26.50 kg/m     General appearance: alert, cooperative and appears stated age  OB US Small early gestational sac, consistent with 5 weeks. 4 small fibroids.  No fetus or yolk sac. No adnexal masses.  Normal ovaries. Small right CL cyst.  Small free fluid in pelvis.   ASSESSMENT  Early pregnancy, look to be intrauterine.  Fibroids.  PLAN  We discussed fibroids, their benign nature, may increase in size during the pregnancy, none appear to impinge on the cavity of the uterus.  hCG today. If hCG is rising normally, she will establish care with an OB group.  She has a list of potential providers.

## 2020-05-01 LAB — BETA HCG QUANT (REF LAB): hCG Quant: 1010 m[IU]/mL

## 2020-06-20 ENCOUNTER — Inpatient Hospital Stay (HOSPITAL_COMMUNITY): Payer: Commercial Managed Care - PPO

## 2020-06-20 ENCOUNTER — Encounter (HOSPITAL_COMMUNITY): Payer: Self-pay | Admitting: *Deleted

## 2020-06-20 ENCOUNTER — Inpatient Hospital Stay (HOSPITAL_COMMUNITY)
Admission: AD | Admit: 2020-06-20 | Discharge: 2020-06-20 | Disposition: A | Discharge status: Home / Self Care | Payer: Commercial Managed Care - PPO | Attending: Obstetrics & Gynecology | Admitting: Obstetrics & Gynecology

## 2020-06-20 DIAGNOSIS — O039 Complete or unspecified spontaneous abortion without complication: Secondary | ICD-10-CM

## 2020-06-20 DIAGNOSIS — Z3A12 12 weeks gestation of pregnancy: Secondary | ICD-10-CM | POA: Diagnosis not present

## 2020-06-20 DIAGNOSIS — O4690 Antepartum hemorrhage, unspecified, unspecified trimester: Secondary | ICD-10-CM

## 2020-06-20 DIAGNOSIS — Z3490 Encounter for supervision of normal pregnancy, unspecified, unspecified trimester: Secondary | ICD-10-CM

## 2020-06-20 DIAGNOSIS — Z6720 Type B blood, Rh positive: Secondary | ICD-10-CM

## 2020-06-20 HISTORY — DX: Other specified health status: Z78.9

## 2020-06-20 LAB — URINALYSIS, ROUTINE W REFLEX MICROSCOPIC
Bacteria, UA: NONE SEEN
Bilirubin Urine: NEGATIVE
Glucose, UA: NEGATIVE mg/dL
Ketones, ur: NEGATIVE mg/dL
Leukocytes,Ua: NEGATIVE
Nitrite: NEGATIVE
Protein, ur: NEGATIVE mg/dL
Specific Gravity, Urine: 1.019 (ref 1.005–1.030)
pH: 6 (ref 5.0–8.0)

## 2020-06-20 LAB — CBC WITH DIFFERENTIAL/PLATELET
Abs Immature Granulocytes: 0.02 10*3/uL (ref 0.00–0.07)
Basophils Absolute: 0 10*3/uL (ref 0.0–0.1)
Basophils Relative: 0 %
Eosinophils Absolute: 0.1 10*3/uL (ref 0.0–0.5)
Eosinophils Relative: 2 %
HCT: 36.2 % (ref 36.0–46.0)
Hemoglobin: 12.2 g/dL (ref 12.0–15.0)
Immature Granulocytes: 0 %
Lymphocytes Relative: 39 %
Lymphs Abs: 2.4 10*3/uL (ref 0.7–4.0)
MCH: 29.6 pg (ref 26.0–34.0)
MCHC: 33.7 g/dL (ref 30.0–36.0)
MCV: 87.9 fL (ref 80.0–100.0)
Monocytes Absolute: 0.5 10*3/uL (ref 0.1–1.0)
Monocytes Relative: 7 %
Neutro Abs: 3.2 10*3/uL (ref 1.7–7.7)
Neutrophils Relative %: 52 %
Platelets: 300 10*3/uL (ref 150–400)
RBC: 4.12 MIL/uL (ref 3.87–5.11)
RDW: 12 % (ref 11.5–15.5)
WBC: 6.2 10*3/uL (ref 4.0–10.5)
nRBC: 0 % (ref 0.0–0.2)

## 2020-06-20 LAB — HIV ANTIBODY (ROUTINE TESTING W REFLEX): HIV Screen 4th Generation wRfx: NONREACTIVE

## 2020-06-20 LAB — ABO/RH: ABO/RH(D): B POS

## 2020-06-20 MED ORDER — OXYCODONE-ACETAMINOPHEN 5-325 MG PO TABS
1.0000 | ORAL_TABLET | ORAL | 0 refills | Status: AC | PRN
Start: 2020-06-20 — End: 2021-06-20

## 2020-06-20 MED ORDER — IBUPROFEN 600 MG PO TABS: 600.0000 mg | ORAL_TABLET | Freq: Four times a day (QID) | ORAL | 0 refills | Status: DC | PRN

## 2020-06-20 NOTE — MAU Provider Note (Signed)
History     CSN: [id number]  Arrival date and time: 06/20/20 1117   None     Chief Complaint  Patient presents with  . Vaginal Bleeding  . Abdominal Pain   HPI   [name] is a 29 y.o. female G1P0000 @ [redacted]w[redacted]d here in MAU with complaints of vaginal bleeding that started this morning. The bleeding is light and pink in color. She has never had bleeding before. She has had 2 US's done in the pregnancy, the second US showed an IUP with a heart beat that was low (97). She denies recent intercourse. She reports generalized abdominal cramping that she rates 6/10. She declines the need for pain medication at this time.   OB History    Gravida  1   Para  0   Term  0   Preterm  0   AB  0   Living  0     SAB  0   TAB  0   Ectopic  0   Multiple  0   Live Births              Past Medical History:  Diagnosis Date  . Medical history non-contributory   . STD (sexually transmitted disease)    HSV1    Past Surgical History:  Procedure Laterality Date  . NO PAST SURGERIES      Family History  Problem Relation Age of Onset  . Hypertension Mother     Social History   Tobacco Use  . Smoking status: Never Smoker  . Smokeless tobacco: Never Used  Substance Use Topics  . Alcohol use: Yes    Comment: once every 2 mths  . Drug use: No    Allergies:  Allergies  Allergen Reactions  . Other     Latex condoms cause irritation    No medications prior to admission.   US OB Comp Less 14 Wks  Result Date: 06/20/2020 CLINICAL DATA:  Assess viability. EXAM: OBSTETRIC <14 WK [place] AND TRANSVAGINAL OB US TECHNIQUE: Both transabdominal and transvaginal ultrasound examinations were performed for complete evaluation of the gestation as well as the maternal uterus, adnexal regions, and pelvic cul-de-sac. Transvaginal technique was performed to assess early pregnancy. COMPARISON:  None. FINDINGS: Intrauterine gestational sac: Single irregular gestational sac noted.  Yolk sac:  Yes Embryo:  Yes Cardiac Activity: No Heart Rate: No bpm MSD: 31.3 mm   8 w   5 d CRL:  3.6 mm   6 w   0 d                  [place] EDC: 02/13/2021 Subchorionic hemorrhage:  None visualized. Maternal uterus/adnexae: Subchorionic hemorrhage: Small subchorionic hemorrhage. Right ovary: Normal Left ovary: Normal Other :Uterine fibroids noted. The largest is identified within the posterior myometrium measuring 2.2 x 2.0 x 1.4 cm Free fluid:  None IMPRESSION: 1. There is a fetal pole without cardiac activity which appears small in relation to the gestational sac. Findings are suspicious but not yet definitive for failed pregnancy. Recommend follow-up US in 10-14 days for definitive diagnosis. This recommendation follows SRU consensus guidelines: Diagnostic Criteria for Nonviable Pregnancy Early in the First Trimester. [place] Med 2013; 240:9735-32. 2. Small subchorionic hemorrhage. Electronically Signed   By: Kerby Moors M.D.   On: 06/20/2020 14:29   US OB Transvaginal  Result Date: 06/20/2020 CLINICAL DATA:  Assess viability. EXAM: OBSTETRIC <14 WK [place] AND TRANSVAGINAL OB US TECHNIQUE: Both transabdominal and transvaginal ultrasound  examinations were performed for complete evaluation of the gestation as well as the maternal uterus, adnexal regions, and pelvic cul-de-sac. Transvaginal technique was performed to assess early pregnancy. COMPARISON:  None. FINDINGS: Intrauterine gestational sac: Single irregular gestational sac noted. Yolk sac:  Yes Embryo:  Yes Cardiac Activity: No Heart Rate: No bpm MSD: 31.3 mm   8 w   5 d CRL:  3.6 mm   6 w   0 d                  [place] EDC: 02/13/2021 Subchorionic hemorrhage:  None visualized. Maternal uterus/adnexae: Subchorionic hemorrhage: Small subchorionic hemorrhage. Right ovary: Normal Left ovary: Normal Other :Uterine fibroids noted. The largest is identified within the posterior myometrium measuring 2.2 x 2.0 x 1.4 cm Free fluid:  None IMPRESSION: 1. There is a fetal  pole without cardiac activity which appears small in relation to the gestational sac. Findings are suspicious but not yet definitive for failed pregnancy. Recommend follow-up US in 10-14 days for definitive diagnosis. This recommendation follows SRU consensus guidelines: Diagnostic Criteria for Nonviable Pregnancy Early in the First Trimester. [employer] Med 2013; 263:7858-85. 2. Small subchorionic hemorrhage. Electronically Signed   By: Kerby Moors M.D.   On: 06/20/2020 14:29    Results for orders placed or performed during the hospital encounter of 06/20/20 (from the past 48 hour(s))  Urinalysis, Routine w reflex microscopic Urine, Clean Catch     Status: Abnormal   Collection Time: 06/20/20 11:55 AM  Result Value Ref Range   Color, Urine YELLOW YELLOW   APPearance HAZY (A) CLEAR   Specific Gravity, Urine 1.019 1.005 - 1.030   pH 6.0 5.0 - 8.0   Glucose, UA NEGATIVE NEGATIVE mg/dL   Hgb urine dipstick MODERATE (A) NEGATIVE   Bilirubin Urine NEGATIVE NEGATIVE   Ketones, ur NEGATIVE NEGATIVE mg/dL   Protein, ur NEGATIVE NEGATIVE mg/dL   Nitrite NEGATIVE NEGATIVE   Leukocytes,Ua NEGATIVE NEGATIVE   RBC / HPF 0-5 0 - 5 RBC/hpf   WBC, UA 0-5 0 - 5 WBC/hpf   Bacteria, UA NONE SEEN NONE SEEN   Squamous Epithelial / LPF 6-10 0 - 5   Mucus PRESENT     Comment: Performed at [facility], [address] [place], [place] [address]  ABO/Rh     Status: None   Collection Time: 06/20/20 12:32 PM  Result Value Ref Range   ABO/RH(D) B POS    No rh immune globuloin      NOT A RH IMMUNE GLOBULIN CANDIDATE, PT RH POSITIVE Performed at [facility] [address], [place], [place] [address]   HIV Antibody (routine testing w rflx)     Status: None   Collection Time: 06/20/20 12:32 PM  Result Value Ref Range   HIV Screen 4th Generation wRfx Non Reactive Non Reactive    Comment: Performed at [facility], [place] [address], [place], [place] [address]  CBC with Differential/Platelet      Status: None   Collection Time: 06/20/20 12:32 PM  Result Value Ref Range   WBC 6.2 4.0 - 10.5 K/uL   RBC 4.12 3.87 - 5.11 MIL/uL   Hemoglobin 12.2 12.0 - 15.0 g/dL   HCT 36.2 36 - 46 %   MCV 87.9 80.0 - 100.0 fL   MCH 29.6 26.0 - 34.0 pg   MCHC 33.7 30.0 - 36.0 g/dL   RDW 12.0 11.5 - 15.5 %   Platelets 300  150 - 400 K/uL   nRBC 0.0 0.0 - 0.2 %   Neutrophils Relative % 52 %   Neutro Abs 3.2 1.7 - 7.7 K/uL   Lymphocytes Relative 39 %   Lymphs Abs 2.4 0.7 - 4.0 K/uL   Monocytes Relative 7 %   Monocytes Absolute 0.5 0.1 - 1.0 K/uL   Eosinophils Relative 2 %   Eosinophils Absolute 0.1 0.0 - 0.5 K/uL   Basophils Relative 0 %   Basophils Absolute 0.0 0.0 - 0.1 K/uL   Immature Granulocytes 0 %   Abs Immature Granulocytes 0.02 0.00 - 0.07 K/uL    Comment: Performed at [facility] [address], [place], [place] [address]    Review of Systems  Constitutional: Negative for fever.  Gastrointestinal: Positive for abdominal pain.  Genitourinary: Positive for vaginal bleeding.   Physical Exam   Blood pressure 104/69, pulse 79, temperature 99.1 F (37.3 C), temperature source Oral, resp. rate 16, height 4\' 11"  (1.499 m), weight 60.2 kg, last menstrual period 03/26/2020, SpO2 100 %.  Physical Exam Vitals and nursing note reviewed.  Constitutional:      General: She is not in acute distress.    Appearance: She is well-developed. She is not toxic-appearing.  HENT:     Head: Normocephalic.  Pulmonary:     Effort: Pulmonary effort is normal.  Neurological:     Mental Status: She is alert and oriented to person, place, and time.    MAU Course  Procedures  Pt informed that the ultrasound is considered a limited OB ultrasound and is not intended to be a complete ultrasound exam.  Patient also informed that the ultrasound is not being completed with the intent of assessing for fetal or placental anomalies or any pelvic abnormalities.  Explained that the purpose of today's  ultrasound is to assess for  viability.  Patient acknowledges the purpose of the exam and the limitations of the study.    No cardiac activity noted on bedside US   MDM  B positive blood type  Transvaginal US ordered d/t bedside US showing fetus without cardiac activity. Patient brought back to MAU and verbal report given to the RN by [place] tech Ernst Breach) that the uterus is empty Transvaginal US ordered- see [place] reports above.  Patient declined pelvic exam; reports small amount of bleeding at the time of discharge.  Per Dr. Lynnette Caffey patient had an US done in the office on 10/28 which showed an IUP with cardiac activity (97) she did not have any other [place] f/u   Assessment and Plan   A:  1. Miscarriage   2. Absent fetal heart rate variability   3. Vaginal bleeding during pregnancy   4. Type B blood, Rh positive     P:  Discharge home in stable condition F/u arranged with the office.  Patient desires observation; declines D & E Rx: Percocet, ibuprofen Bleeding precautions Return to MAU if symptoms worsen  Tailor Lucking, Artist Pais, NP 06/20/2020 4:29 PM

## 2020-06-20 NOTE — MAU Note (Addendum)
Has been cramping, started last night.  This morning had some light bleeding.  Had [place] at 6wks showing viable IUP.

## 2020-06-20 NOTE — Discharge Instructions (Signed)
Miscarriage A miscarriage is the loss of an unborn baby (fetus) before the 20th week of pregnancy. Follow these instructions at home: Medicines   Take over-the-counter and prescription medicines only as told by your doctor.  If you were prescribed antibiotic medicine, take it as told by your doctor. Do not stop taking the antibiotic even if you start to feel better.  Do not take NSAIDs unless your doctor says that this is safe for you. NSAIDs include aspirin and ibuprofen. These medicines can cause bleeding. Activity  Rest as directed. Ask your doctor what activities are safe for you.  Have someone help you at home during this time. General instructions  Write down how many pads you use each day and how soaked they are.  Watch the amount of tissue or clumps of blood (blood clots) that you pass from your vagina. Save any large amounts of tissue for your doctor.  Do not use tampons, douche, or have sex until your doctor approves.  To help you and your partner with the process of grieving, talk with your doctor or seek counseling.  When you are ready, meet with your doctor to talk about steps you should take for your health. Also, talk with your doctor about steps to take to have a healthy pregnancy in the future.  Keep all follow-up visits as told by your doctor. This is important. Contact a doctor if:  You have a fever or chills.  You have vaginal discharge that smells bad.  You have more bleeding. Get help right away if:  You have very bad cramps or pain in your back or belly.  You pass clumps of blood that are walnut-sized or larger from your vagina.  You pass tissue that is walnut-sized or larger from your vagina.  You soak more than 1 regular pad in an hour.  You get light-headed or weak.  You faint (pass out).  You have feelings of sadness that do not go away, or you have thoughts of hurting yourself. Summary  A miscarriage is the loss of an unborn baby before  the 20th week of pregnancy.  Follow your doctor's instructions for home care. Keep all follow-up appointments.  To help you and your partner with the process of grieving, talk with your doctor or seek counseling. This information is not intended to replace advice given to you by your health care provider. Make sure you discuss any questions you have with your health care provider. Document Revised: 11/02/2018 Document Reviewed: 08/16/2016 Elsevier Patient Education  2020 Elsevier Inc.  

## 2020-08-04 NOTE — Progress Notes (Deleted)
30 y.o. G1P0000 Single African American female here for annual exam.    PCP:   None per patient   Patient's last menstrual period was 03/26/2020 (exact date).           Sexually active: {yes no:314532}  The current method of family planning is OCP (estrogen/progesterone).    Exercising: {yes no:314532}  {types:19826} Smoker:  no  Health Maintenance: Pap:  08-04-15 negative  History of abnormal Pap:  no TDaP:  2013 Gardasil:   {YES NO:22349} HIV: 06-20-20 negative  Hep C: 09-08-17 negative  Screening Labs:  Hb today: ***, Urine today: ***   reports that she has never smoked. She has never used smokeless tobacco. She reports current alcohol use. She reports that she does not use drugs.  Past Medical History:  Diagnosis Date  . Medical history non-contributory   . STD (sexually transmitted disease)    HSV1    Past Surgical History:  Procedure Laterality Date  . NO PAST SURGERIES      Current Outpatient Medications  Medication Sig Dispense Refill  . ibuprofen (ADVIL) 600 MG tablet Take 1 tablet (600 mg total) by mouth every 6 (six) hours as needed for mild pain. 30 tablet 0  . oxyCODONE-acetaminophen (PERCOCET) 5-325 MG tablet Take 1-2 tablets by mouth every 4 (four) hours as needed for severe pain. 10 tablet 0   No current facility-administered medications for this visit.    Family History  Problem Relation Age of Onset  . Hypertension Mother     Review of Systems  Exam:   LMP 03/26/2020 (Exact Date)     General appearance: alert, cooperative and appears stated age Head: normocephalic, without obvious abnormality, atraumatic Neck: no adenopathy, supple, symmetrical, trachea midline and thyroid normal to inspection and palpation Lungs: clear to auscultation bilaterally Breasts: normal appearance, no masses or tenderness, No nipple retraction or dimpling, No nipple discharge or bleeding, No axillary adenopathy Heart: regular rate and rhythm Abdomen: soft,  non-tender; no masses, no organomegaly Extremities: extremities normal, atraumatic, no cyanosis or edema Skin: skin color, texture, turgor normal. No rashes or lesions Lymph nodes: cervical, supraclavicular, and axillary nodes normal. Neurologic: grossly normal  Pelvic: External genitalia:  no lesions              No abnormal inguinal nodes palpated.              Urethra:  normal appearing urethra with no masses, tenderness or lesions              Bartholins and Skenes: normal                 Vagina: normal appearing vagina with normal color and discharge, no lesions              Cervix: no lesions              Pap taken: {yes no:314532} Bimanual Exam:  Uterus:  normal size, contour, position, consistency, mobility, non-tender              Adnexa: no mass, fullness, tenderness              Rectal exam: {yes no:314532}.  Confirms.              Anus:  normal sphincter tone, no lesions  Chaperone was present for exam.  Assessment:   Well woman visit with normal exam.   Plan: Mammogram screening discussed. Self breast awareness reviewed. Pap and HR HPV as above. Guidelines  for Calcium, Vitamin D, regular exercise program including cardiovascular and weight bearing exercise.   Follow up annually and prn.   Additional counseling given.  {yes [id number]. _______ minutes face to face time of which over 50% was spent in counseling.    After visit summary provided.

## 2020-08-05 ENCOUNTER — Ambulatory Visit: Payer: Commercial Managed Care - PPO | Admitting: Obstetrics and Gynecology

## 2021-01-08 LAB — OB RESULTS CONSOLE ANTIBODY SCREEN: Antibody Screen: NEGATIVE

## 2021-01-08 LAB — OB RESULTS CONSOLE RPR: RPR: NONREACTIVE

## 2021-01-08 LAB — OB RESULTS CONSOLE ABO/RH: RH Type: POSITIVE

## 2021-01-08 LAB — OB RESULTS CONSOLE RUBELLA ANTIBODY, IGM: Rubella: IMMUNE

## 2021-01-08 LAB — OB RESULTS CONSOLE HEPATITIS B SURFACE ANTIGEN: Hepatitis B Surface Ag: NEGATIVE

## 2021-01-08 LAB — HEPATITIS C ANTIBODY: HCV Ab: NEGATIVE

## 2021-01-08 LAB — OB RESULTS CONSOLE HIV ANTIBODY (ROUTINE TESTING): HIV: NONREACTIVE

## 2021-01-20 LAB — OB RESULTS CONSOLE GC/CHLAMYDIA
Chlamydia: NEGATIVE
Gonorrhea: NEGATIVE

## 2021-07-21 LAB — OB RESULTS CONSOLE GBS: GBS: NEGATIVE

## 2021-07-25 NOTE — L&D Delivery Note (Signed)
Operative Delivery Note At 5:25 PM a viable female was delivered via Vaginal, Spontaneous.  Presentation: vertex; Position: Right,, Occiput,, Anterior; Station: +3.  Verbal consent: obtained from patient.  Risks and benefits discussed in detail.  Risks include, but are not limited to the risks of anesthesia, bleeding, infection, damage to maternal tissues, fetal cephalhematoma.  There is also the risk of inability to effect vaginal delivery of the head, or shoulder dystocia that cannot be resolved by established maneuvers, leading to the need for emergency cesarean section.  APGAR: 8, 9; weight pending .   Placenta status: ,spontaneously with 3 vessel cord .   Cord:  with the following complications: Nuchal cord x 1 .  Cord pH: not obtained  Anesthesia: epidural  Instruments: mushroom with 2 pulls and no popoffs Episiotomy: None Lacerations:   Suture Repair: 3.0 chromic Est. Blood Loss (mL):  300  Mom to postpartum.  Baby to Couplet care / Skin to Skin.  Cyril Mourning 08/18/2021, 5:44 PM

## 2021-07-28 ENCOUNTER — Other Ambulatory Visit (HOSPITAL_COMMUNITY): Payer: Self-pay

## 2021-07-29 ENCOUNTER — Other Ambulatory Visit: Payer: Self-pay

## 2021-07-29 ENCOUNTER — Encounter (HOSPITAL_COMMUNITY)
Admission: RE | Admit: 2021-07-29 | Discharge: 2021-07-29 | Disposition: A | Discharge status: Home / Self Care | Payer: Commercial Managed Care - PPO | Source: Ambulatory Visit | Attending: Obstetrics and Gynecology | Admitting: Obstetrics and Gynecology

## 2021-07-29 DIAGNOSIS — D649 Anemia, unspecified: Secondary | ICD-10-CM | POA: Diagnosis not present

## 2021-07-29 DIAGNOSIS — Z3A00 Weeks of gestation of pregnancy not specified: Secondary | ICD-10-CM | POA: Diagnosis not present

## 2021-07-29 DIAGNOSIS — O99119 Other diseases of the blood and blood-forming organs and certain disorders involving the immune mechanism complicating pregnancy, unspecified trimester: Secondary | ICD-10-CM | POA: Insufficient documentation

## 2021-07-29 DIAGNOSIS — O99019 Anemia complicating pregnancy, unspecified trimester: Secondary | ICD-10-CM | POA: Diagnosis present

## 2021-07-29 MED ORDER — SODIUM CHLORIDE 0.9 % IV SOLN
510.0000 mg | INTRAVENOUS | Status: DC
  Administered 2021-07-29: 510 mg via INTRAVENOUS
  Filled 2021-07-29: qty 510

## 2021-08-02 ENCOUNTER — Encounter (HOSPITAL_COMMUNITY)
Admission: RE | Admit: 2021-08-02 | Discharge: 2021-08-02 | Disposition: A | Discharge status: Home / Self Care | Payer: Commercial Managed Care - PPO | Source: Ambulatory Visit | Attending: Obstetrics and Gynecology | Admitting: Obstetrics and Gynecology

## 2021-08-02 DIAGNOSIS — O99019 Anemia complicating pregnancy, unspecified trimester: Secondary | ICD-10-CM | POA: Diagnosis not present

## 2021-08-02 MED ORDER — SODIUM CHLORIDE 0.9 % IV SOLN
510.0000 mg | INTRAVENOUS | Status: DC
  Administered 2021-08-02: 510 mg via INTRAVENOUS
  Filled 2021-08-02: qty 510

## 2021-08-08 IMAGING — US US OB COMP LESS 14 WK
2 series · 15 of 28 positions shown · non-contrast
Comparison: None.

CLINICAL DATA: Assess viability.

EXAM:
OBSTETRIC <14 WK US AND TRANSVAGINAL OB US
TECHNIQUE: Both transabdominal and transvaginal ultrasound examinations were
performed for complete evaluation of the gestation as well as the
maternal uterus, adnexal regions, and pelvic cul-de-sac.
Transvaginal technique was performed to assess early pregnancy.

[Series 1: us ob comp less 14 wk · 31 acquisitions, 6 frames shown (1 of 2)]
[im 1/31]
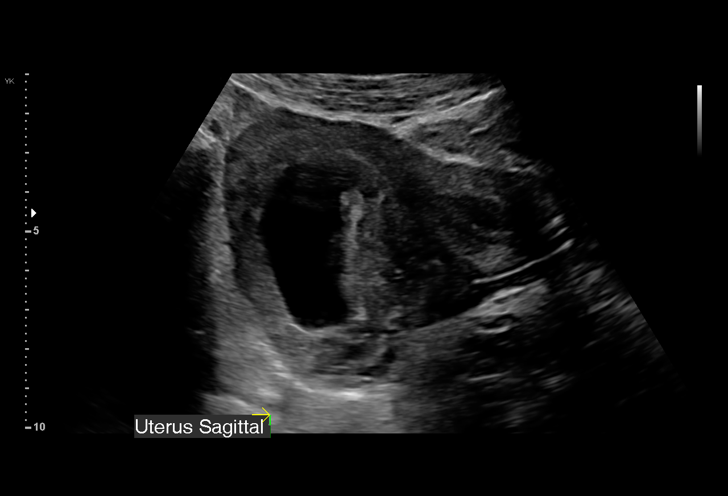
[im 6/31]
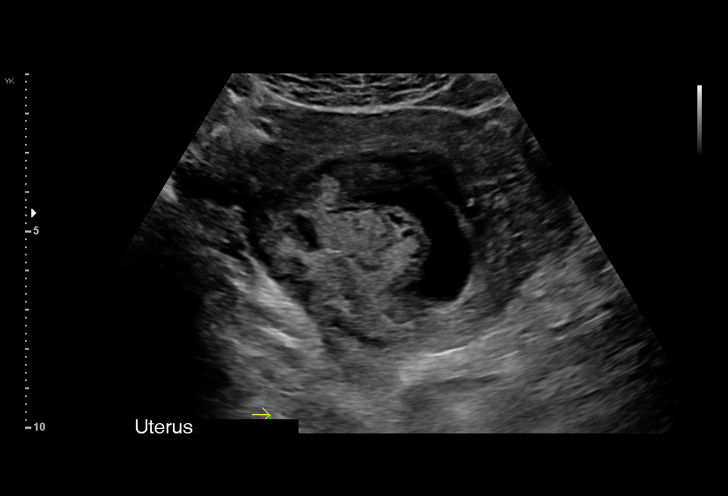
[im 11/31]
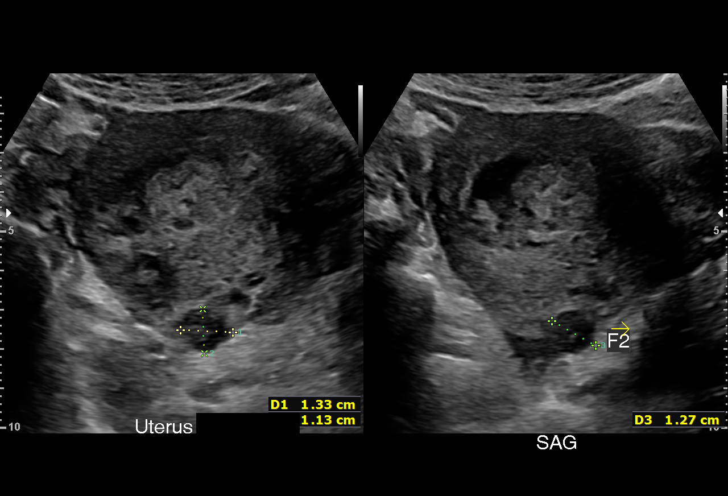
[im 17/31]
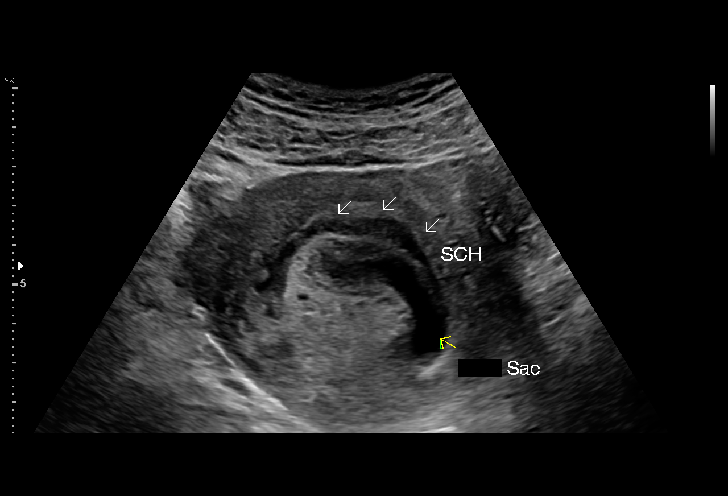
[im 22/31]
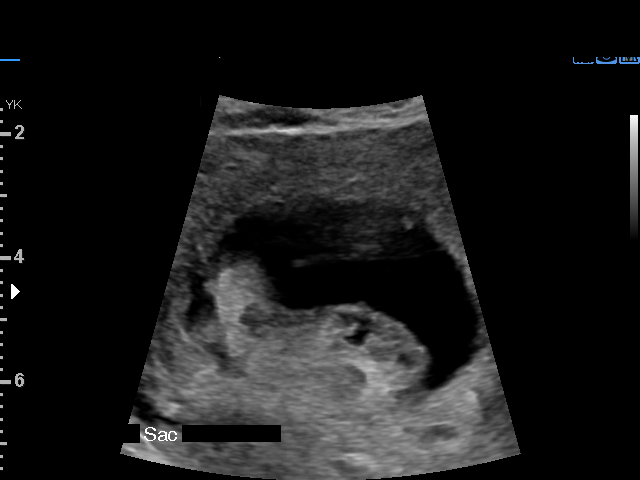
[im 28/31]
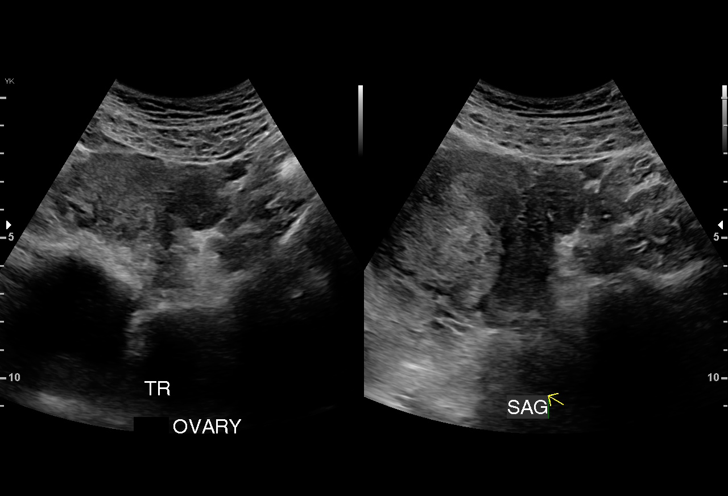

[Series 3: us ob comp less 14 wk · 42 acquisitions, 9 frames shown (2 of 2)]
[im 1/42]
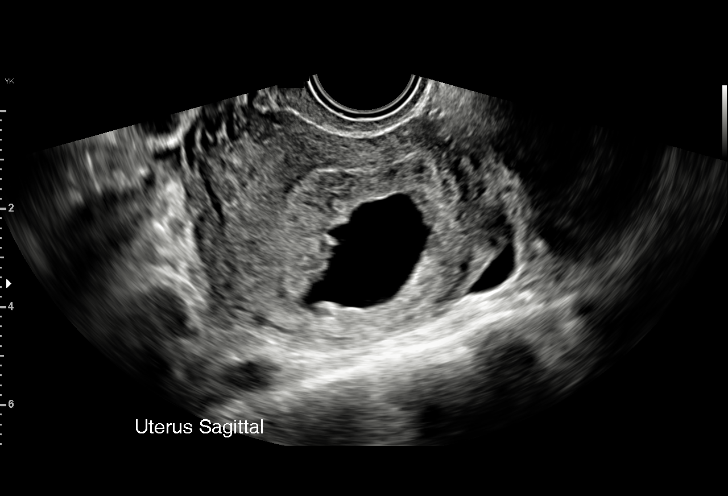
[im 6/42]
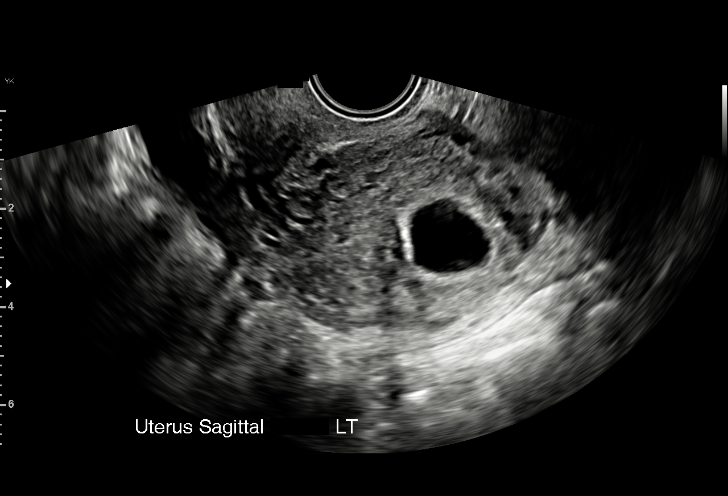
[im 9/42]
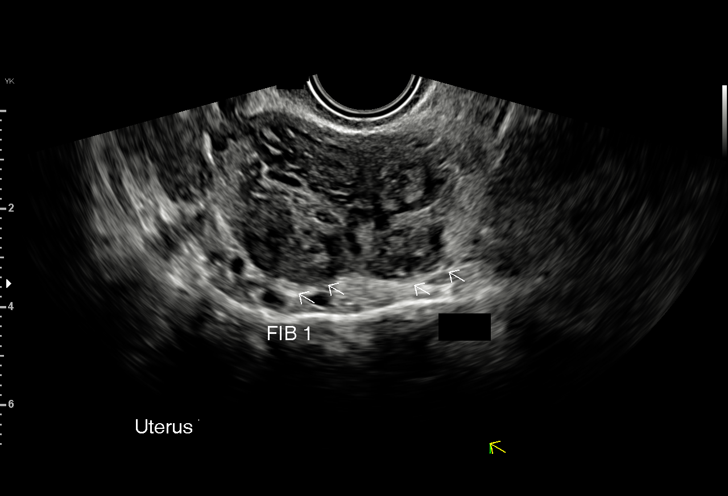
[im 14/42]
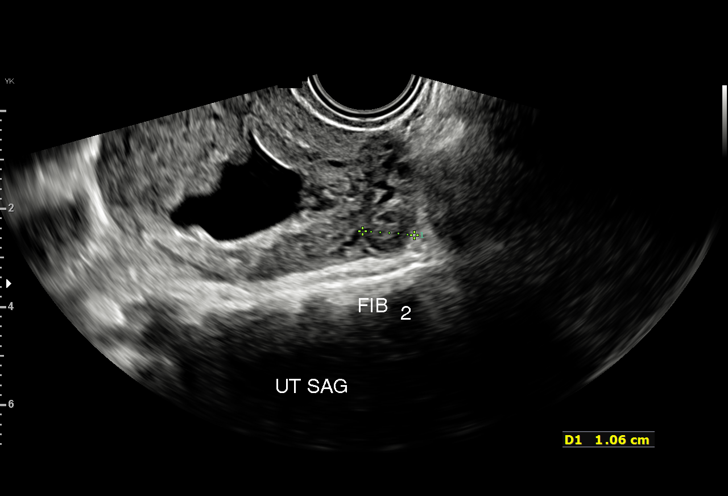
[im 20/42]
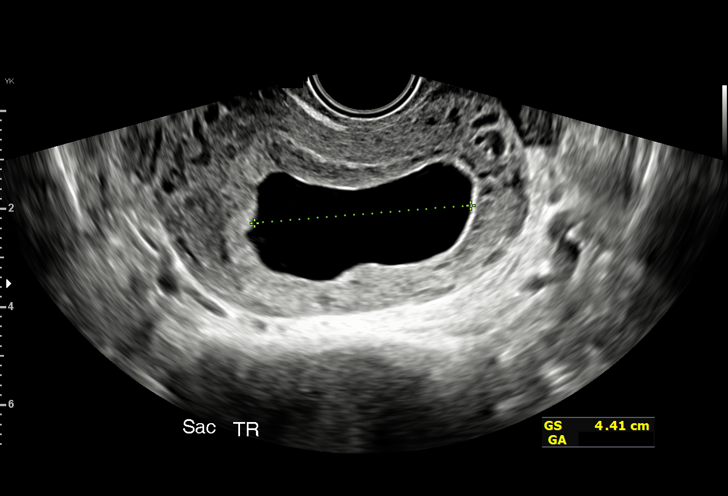
[im 25/42]
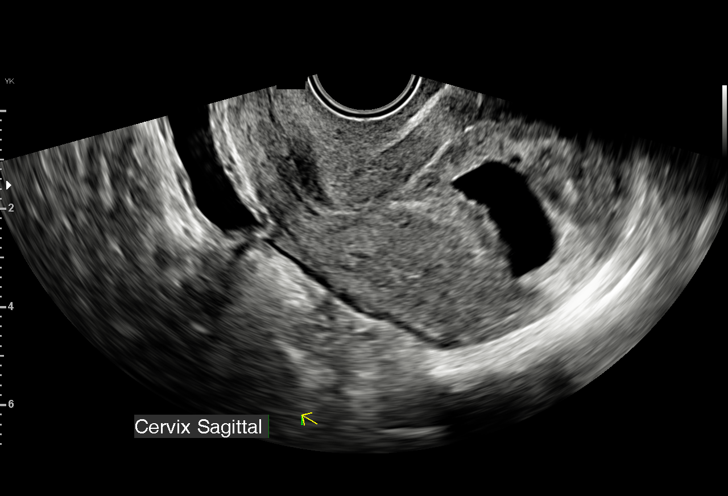
[im 31/42]
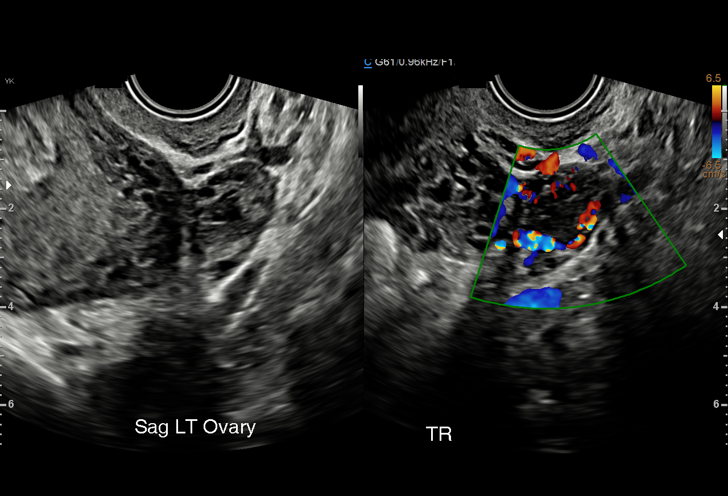
[im 36/42]
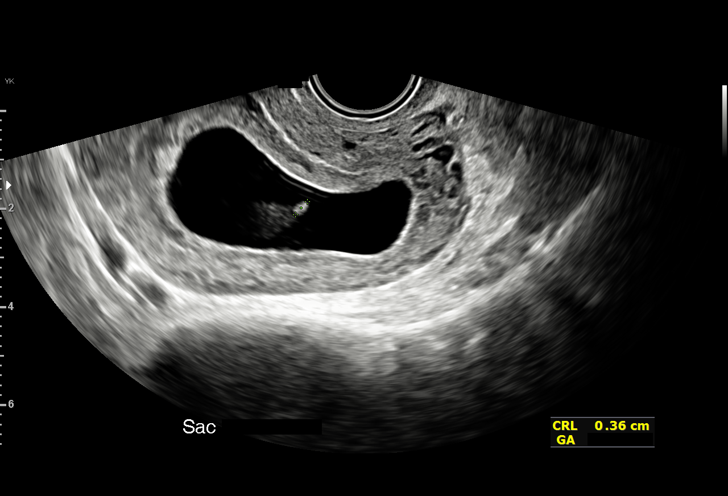
[im 42/42]
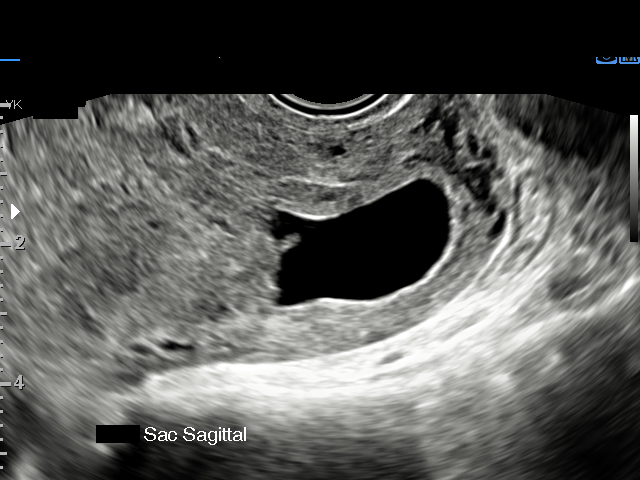

[15 of 28 positions shown; findings below may reference images not displayed]

FINDINGS: Intrauterine gestational sac: Single irregular gestational sac
noted.

Yolk sac:  Yes

Embryo:  Yes

Cardiac Activity: No

Heart Rate: No bpm

MSD: 31.3 mm   8 w   5 d

CRL:  3.6 mm   6 w   0 d                  US EDC: 02/13/2021

Subchorionic hemorrhage:  None visualized.

Maternal uterus/adnexae:

Subchorionic hemorrhage: Small subchorionic hemorrhage.

Right ovary: Normal

Left ovary: Normal

Other :Uterine fibroids noted. The largest is identified within the
posterior myometrium measuring 2.2 x 2.0 x 1.4 cm

Free fluid:  None
IMPRESSION: 1. There is a fetal pole without cardiac activity which appears
small in relation to the gestational sac. Findings are suspicious
but not yet definitive for failed pregnancy. Recommend follow-up US
in 10-14 days for definitive diagnosis. This recommendation follows
SRU consensus guidelines: Diagnostic Criteria for Nonviable
Pregnancy Early in the First Trimester. N Engl J Med 9457;
2. Small subchorionic hemorrhage.

## 2021-08-08 IMAGING — US US OB TRANSVAGINAL
1 series · 15 of 28 positions shown · non-contrast
Comparison: None.

CLINICAL DATA: Assess viability.

EXAM:
OBSTETRIC <14 WK US AND TRANSVAGINAL OB US
TECHNIQUE: Both transabdominal and transvaginal ultrasound examinations were
performed for complete evaluation of the gestation as well as the
maternal uterus, adnexal regions, and pelvic cul-de-sac.
Transvaginal technique was performed to assess early pregnancy.

[Series 1: us ob transvaginal · 33 acquisitions, 15 frames shown]
[im 1/33]
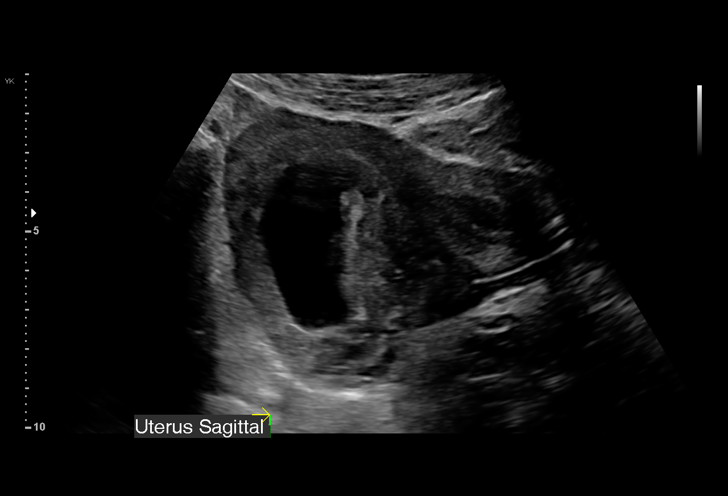
[im 3/33]
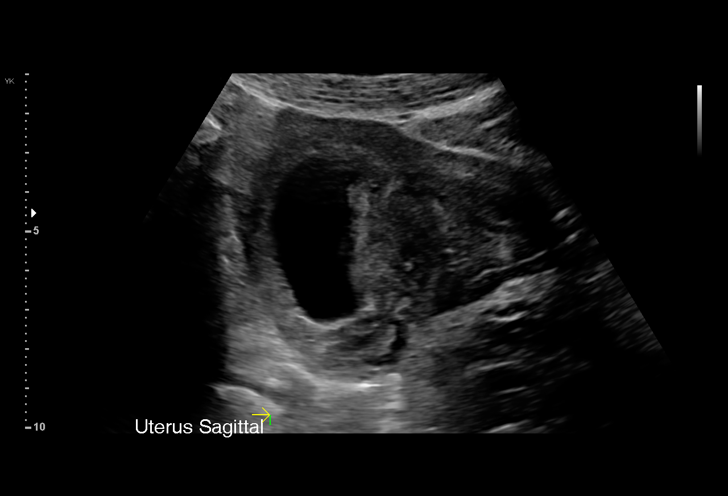
[im 5/33]
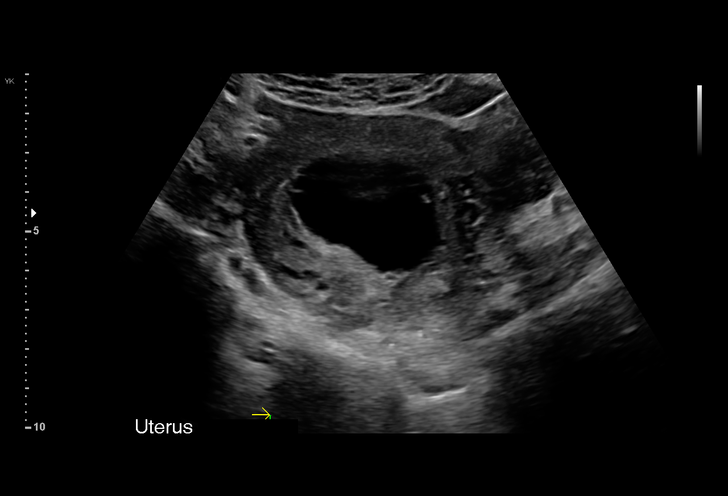
[im 8/33]
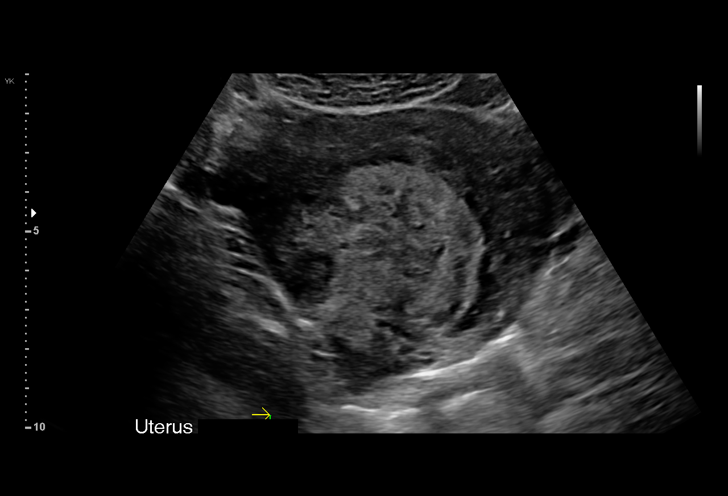
[im 10/33]
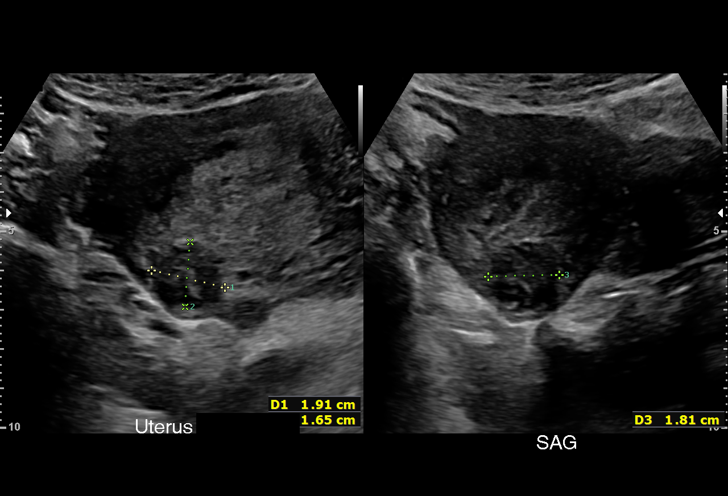
[im 12/33]
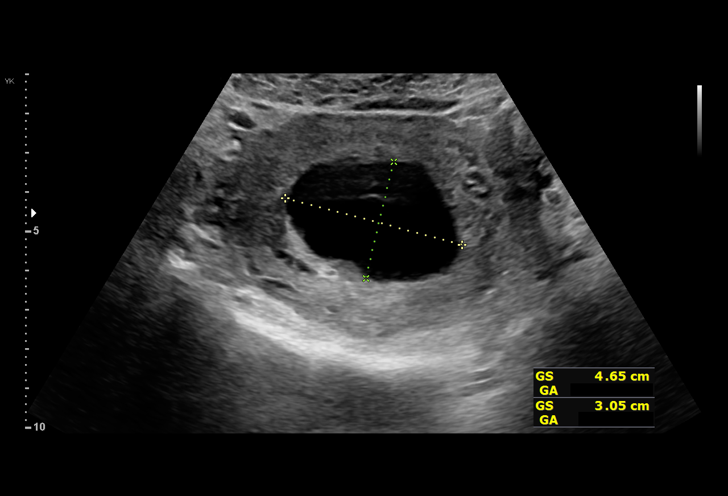
[im 15/33]
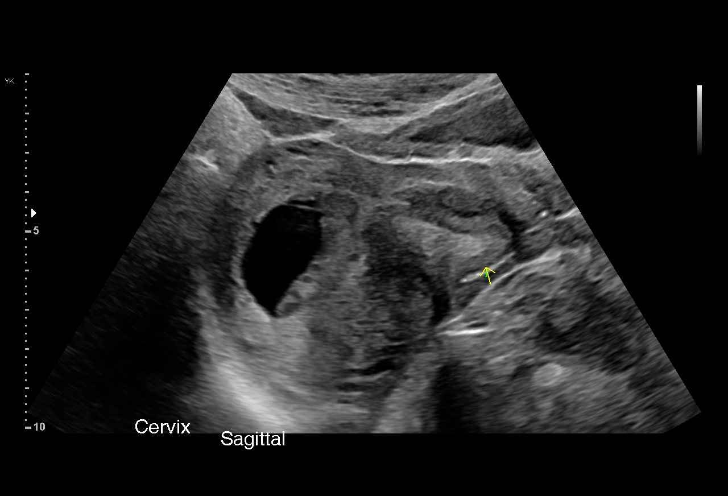
[im 17/33]
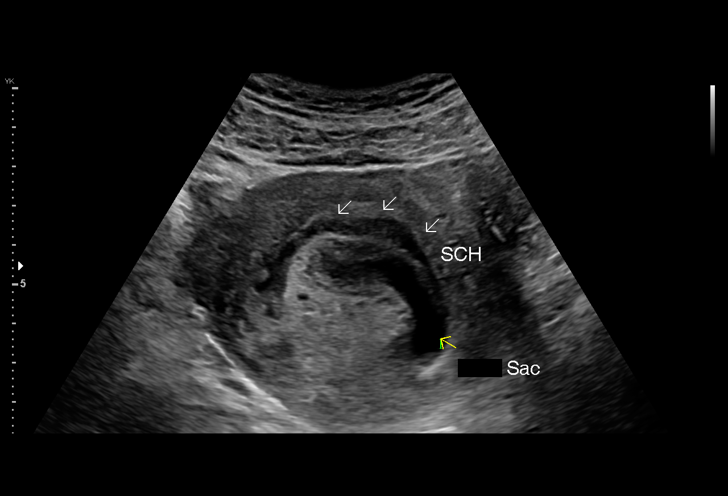
[im 18/33]
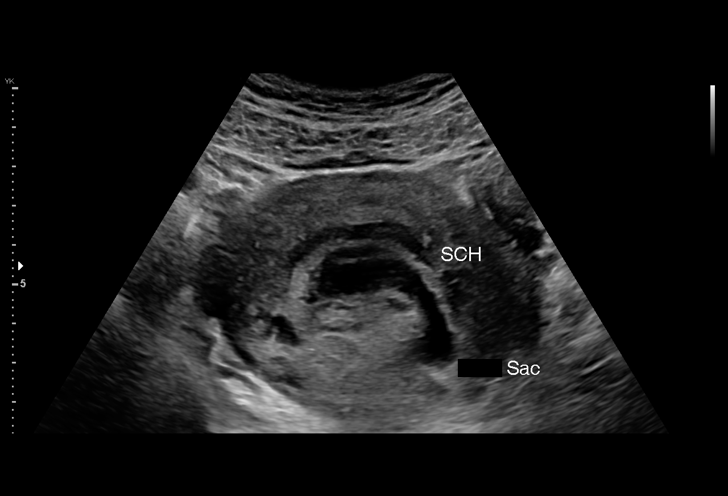
[im 21/33]
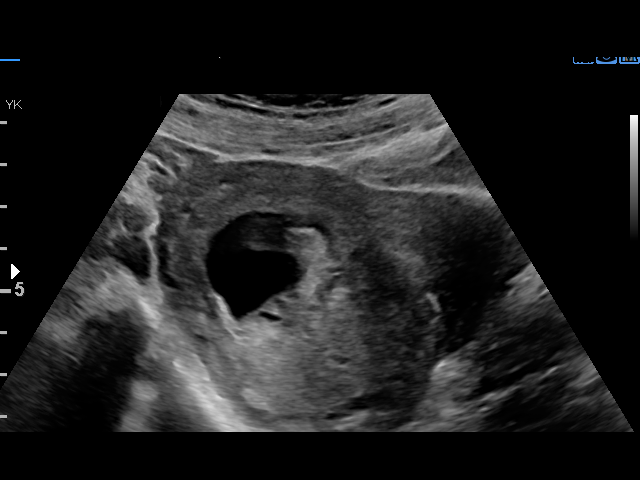
[im 23/33]
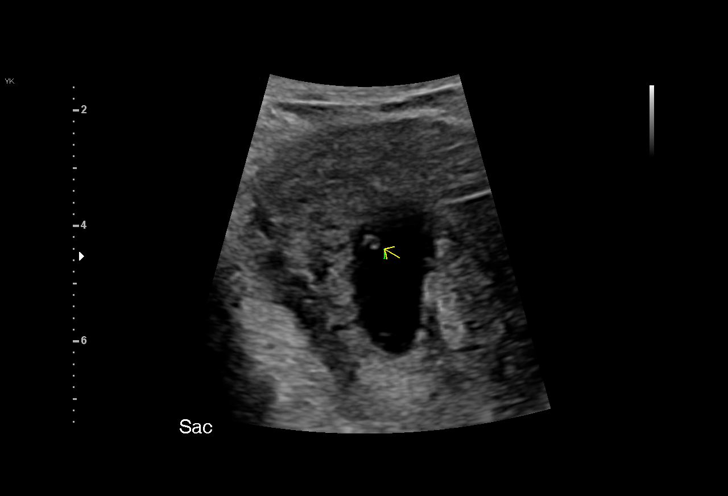
[im 25/33]
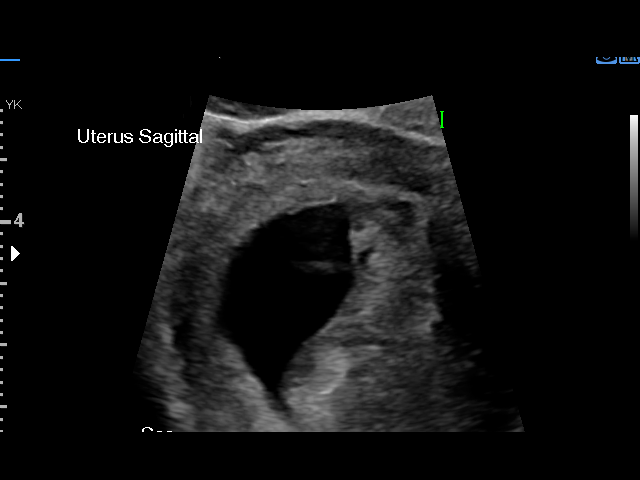
[im 28/33]
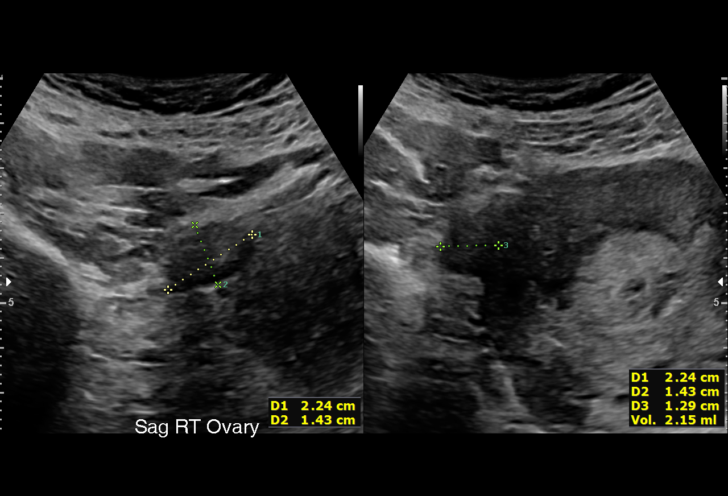
[im 30/33]
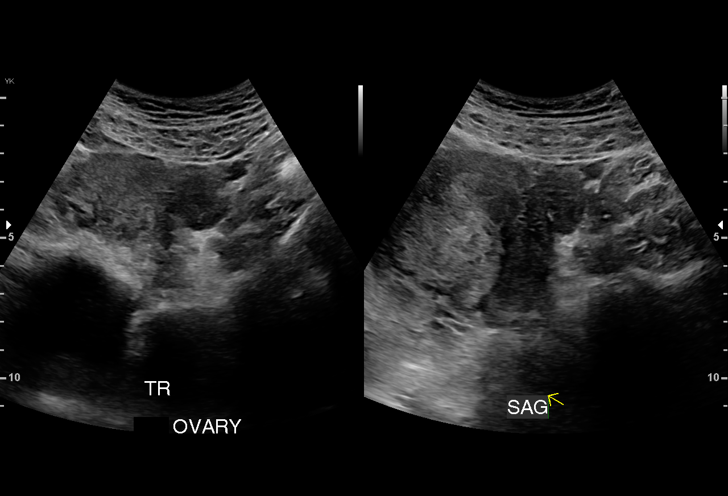
[im 33/33]
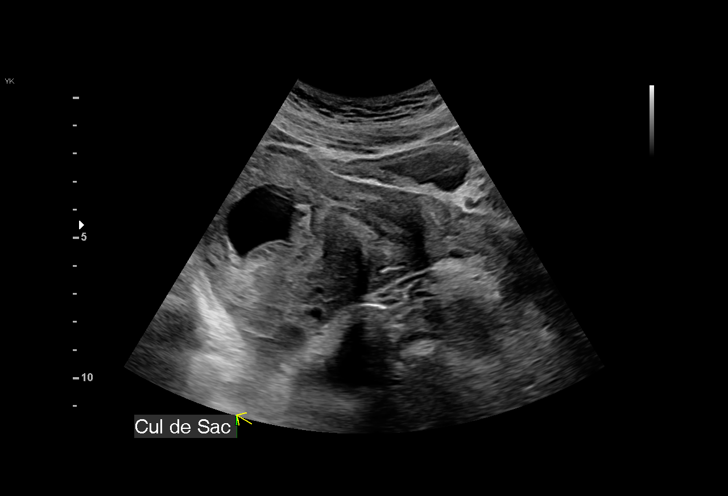

[15 of 28 positions shown; findings below may reference images not displayed]

FINDINGS: Intrauterine gestational sac: Single irregular gestational sac
noted.

Yolk sac:  Yes

Embryo:  Yes

Cardiac Activity: No

Heart Rate: No bpm

MSD: 31.3 mm   8 w   5 d

CRL:  3.6 mm   6 w   0 d                  US EDC: 02/13/2021

Subchorionic hemorrhage:  None visualized.

Maternal uterus/adnexae:

Subchorionic hemorrhage: Small subchorionic hemorrhage.

Right ovary: Normal

Left ovary: Normal

Other :Uterine fibroids noted. The largest is identified within the
posterior myometrium measuring 2.2 x 2.0 x 1.4 cm

Free fluid:  None
IMPRESSION: 1. There is a fetal pole without cardiac activity which appears
small in relation to the gestational sac. Findings are suspicious
but not yet definitive for failed pregnancy. Recommend follow-up US
in 10-14 days for definitive diagnosis. This recommendation follows
SRU consensus guidelines: Diagnostic Criteria for Nonviable
Pregnancy Early in the First Trimester. N Engl J Med 9457;
2. Small subchorionic hemorrhage.

## 2021-08-14 ENCOUNTER — Other Ambulatory Visit: Payer: Self-pay

## 2021-08-14 ENCOUNTER — Inpatient Hospital Stay (HOSPITAL_COMMUNITY)
Admission: AD | Admit: 2021-08-14 | Discharge: 2021-08-14 | Disposition: A | Discharge status: Home / Self Care | Payer: Commercial Managed Care - PPO | Attending: Obstetrics & Gynecology | Admitting: Obstetrics & Gynecology

## 2021-08-14 ENCOUNTER — Encounter (HOSPITAL_COMMUNITY): Payer: Self-pay | Admitting: Obstetrics & Gynecology

## 2021-08-14 DIAGNOSIS — Z0371 Encounter for suspected problem with amniotic cavity and membrane ruled out: Secondary | ICD-10-CM | POA: Diagnosis not present

## 2021-08-14 DIAGNOSIS — Z3A39 39 weeks gestation of pregnancy: Secondary | ICD-10-CM | POA: Diagnosis not present

## 2021-08-14 DIAGNOSIS — O26893 Other specified pregnancy related conditions, third trimester: Secondary | ICD-10-CM | POA: Insufficient documentation

## 2021-08-14 DIAGNOSIS — N898 Other specified noninflammatory disorders of vagina: Secondary | ICD-10-CM | POA: Diagnosis not present

## 2021-08-14 LAB — AMNISURE RUPTURE OF MEMBRANE (ROM) NOT AT ARMC: Amnisure ROM: NEGATIVE

## 2021-08-14 LAB — POCT FERN TEST: POCT Fern Test: NEGATIVE

## 2021-08-14 NOTE — MAU Provider Note (Signed)
S: Jeanne Harper is a 31 y.o. G2P0010 at [redacted]w[redacted]d  who presents to MAU today complaining of leaking of fluid since 0200. She denies vaginal bleeding. She endorses contractions. She reports normal fetal movement.    O: BP 111/76 (BP Location: Right Arm)    Pulse 84    Temp 98.4 F (36.9 C) (Oral)    Resp 17    Ht 5\' 4"  (1.626 m)    Wt 75.1 kg    SpO2 98%    BMI 28.41 kg/m  GENERAL: Well-developed, well-nourished female in no acute distress.  HEAD: Normocephalic, atraumatic.  CHEST: Normal effort of breathing, regular heart rate ABDOMEN: Soft, nontender, gravid PELVIC: Normal external female genitalia. Vagina is pink and rugated. Cervix with normal contour, no lesions. Normal thin, white discharge.  Negative pooling.   Cervical exam:  Dilation: 1.5 Effacement (%): 40 Cervical Position: Middle Station: -2 Presentation: Vertex Exam by:: Apolinar Junes RN   Fetal Monitoring: Baseline: 125 bpm Variability: moderate Accelerations: present Decelerations: absent Contractions: irregular every 10 minutes  Results for orders placed or performed during the hospital encounter of 08/14/21 (from the past 24 hour(s))  Amnisure rupture of membrane (rom)not at [facility]     Status: None   Collection Time: 08/14/21  9:04 PM  Result Value Ref Range   Amnisure ROM NEGATIVE   POCT fern test     Status: Normal   Collection Time: 08/14/21  9:12 PM  Result Value Ref Range   POCT Fern Test Negative = intact amniotic membranes     A: [facility] at [redacted]w[redacted]d  Membranes intact No leakage of amniotic fluid into vagina  [redacted] weeks gestation of pregnancy   P: - Discharge patient - Keep scheduled appt with [facility] on Tuesday 08/17/2021 - Patient verbalized an understanding of the plan of care and agrees.    Laury Deep, CNM 08/14/2021, 9:06 PM

## 2021-08-14 NOTE — MAU Note (Signed)
..  Jeanne Harper is a 31 y.o. at [redacted]w[redacted]d here in MAU reporting: LOF clear around 0200 and pr reports starting having CTX that are not frequent. Pt denies concerns in pregnancy, DFM, VB, PIH s/s, and abnormal discharge. Pt reports last intercourse was 2 weeks ago. SVE in office on 08/10/21 1 cm  Onset of complaint: 0200 Pain score: 5/10 ctx  Vitals:   08/14/21 1951  BP: 111/81  Pulse: 81  Resp: 18  Temp: 98.4 F (36.9 C)  SpO2: 98%     FHT:125 Lab orders placed from triage:

## 2021-08-18 ENCOUNTER — Encounter (HOSPITAL_COMMUNITY): Payer: Self-pay | Admitting: Obstetrics and Gynecology

## 2021-08-18 ENCOUNTER — Inpatient Hospital Stay (HOSPITAL_COMMUNITY): Payer: Commercial Managed Care - PPO | Admitting: Anesthesiology

## 2021-08-18 ENCOUNTER — Inpatient Hospital Stay (HOSPITAL_COMMUNITY)
Admission: AD | Admit: 2021-08-18 | Discharge: 2021-08-20 | DRG: 806 | Disposition: A | Discharge status: Home / Self Care | Payer: Commercial Managed Care - PPO | Attending: Obstetrics and Gynecology | Admitting: Obstetrics and Gynecology

## 2021-08-18 ENCOUNTER — Telehealth (HOSPITAL_COMMUNITY): Payer: Self-pay | Admitting: *Deleted

## 2021-08-18 ENCOUNTER — Other Ambulatory Visit: Payer: Self-pay

## 2021-08-18 DIAGNOSIS — Z20822 Contact with and (suspected) exposure to covid-19: Secondary | ICD-10-CM | POA: Diagnosis present

## 2021-08-18 DIAGNOSIS — O480 Post-term pregnancy: Principal | ICD-10-CM | POA: Diagnosis present

## 2021-08-18 DIAGNOSIS — O9081 Anemia of the puerperium: Secondary | ICD-10-CM | POA: Diagnosis not present

## 2021-08-18 DIAGNOSIS — O26893 Other specified pregnancy related conditions, third trimester: Secondary | ICD-10-CM | POA: Diagnosis present

## 2021-08-18 DIAGNOSIS — Z3A40 40 weeks gestation of pregnancy: Secondary | ICD-10-CM

## 2021-08-18 DIAGNOSIS — D62 Acute posthemorrhagic anemia: Secondary | ICD-10-CM | POA: Diagnosis not present

## 2021-08-18 LAB — RESP PANEL BY RT-PCR (FLU A&B, COVID) ARPGX2
Influenza A by PCR: NEGATIVE
Influenza B by PCR: NEGATIVE
SARS Coronavirus 2 by RT PCR: NEGATIVE

## 2021-08-18 LAB — CBC
HCT: 31.1 % — ABNORMAL LOW (ref 36.0–46.0)
Hemoglobin: 10.1 g/dL — ABNORMAL LOW (ref 12.0–15.0)
MCH: 29 pg (ref 26.0–34.0)
MCHC: 32.5 g/dL (ref 30.0–36.0)
MCV: 89.4 fL (ref 80.0–100.0)
Platelets: 197 10*3/uL (ref 150–400)
RBC: 3.48 MIL/uL — ABNORMAL LOW (ref 3.87–5.11)
RDW: 15.9 % — ABNORMAL HIGH (ref 11.5–15.5)
WBC: 9.6 10*3/uL (ref 4.0–10.5)
nRBC: 0.3 % — ABNORMAL HIGH (ref 0.0–0.2)

## 2021-08-18 LAB — RPR: RPR Ser Ql: NONREACTIVE

## 2021-08-18 LAB — TYPE AND SCREEN
ABO/RH(D): B POS
Antibody Screen: NEGATIVE

## 2021-08-18 MED ORDER — EPHEDRINE 5 MG/ML INJ
10.0000 mg | INTRAVENOUS | Status: DC | PRN
  Filled 2021-08-18: qty 2

## 2021-08-18 MED ORDER — SIMETHICONE 80 MG PO CHEW: 80.0000 mg | CHEWABLE_TABLET | ORAL | Status: DC | PRN

## 2021-08-18 MED ORDER — IBUPROFEN 600 MG PO TABS
600.0000 mg | ORAL_TABLET | Freq: Four times a day (QID) | ORAL | Status: DC
  Administered 2021-08-18 – 2021-08-20 (×8): 600 mg via ORAL
  Filled 2021-08-18 (×8): qty 1

## 2021-08-18 MED ORDER — BENZOCAINE-MENTHOL 20-0.5 % EX AERO
1.0000 "application " | INHALATION_SPRAY | CUTANEOUS | Status: DC | PRN
  Administered 2021-08-19: 1 via TOPICAL
  Filled 2021-08-18: qty 56

## 2021-08-18 MED ORDER — DIBUCAINE (PERIANAL) 1 % EX OINT
1.0000 "application " | TOPICAL_OINTMENT | CUTANEOUS | Status: DC | PRN
  Administered 2021-08-19: 1 via RECTAL
  Filled 2021-08-18: qty 28

## 2021-08-18 MED ORDER — SOD CITRATE-CITRIC ACID 500-334 MG/5ML PO SOLN: 30.0000 mL | ORAL | Status: DC | PRN

## 2021-08-18 MED ORDER — LIDOCAINE HCL (PF) 1 % IJ SOLN: 30.0000 mL | INTRAMUSCULAR | Status: DC | PRN

## 2021-08-18 MED ORDER — MEASLES, MUMPS & RUBELLA VAC IJ SOLR: 0.5000 mL | Freq: Once | INTRAMUSCULAR | Status: DC

## 2021-08-18 MED ORDER — BUTORPHANOL TARTRATE 1 MG/ML IJ SOLN
2.0000 mg | Freq: Once | INTRAMUSCULAR | Status: AC
  Administered 2021-08-18: 11:00:00 2 mg via INTRAVENOUS

## 2021-08-18 MED ORDER — OXYCODONE-ACETAMINOPHEN 5-325 MG PO TABS: 2.0000 | ORAL_TABLET | ORAL | Status: DC | PRN

## 2021-08-18 MED ORDER — MEDROXYPROGESTERONE ACETATE 150 MG/ML IM SUSP: 150.0000 mg | INTRAMUSCULAR | Status: DC | PRN

## 2021-08-18 MED ORDER — DIPHENHYDRAMINE HCL 25 MG PO CAPS: 25.0000 mg | ORAL_CAPSULE | Freq: Four times a day (QID) | ORAL | Status: DC | PRN

## 2021-08-18 MED ORDER — LACTATED RINGERS IV SOLN: 500.0000 mL | INTRAVENOUS | Status: DC | PRN

## 2021-08-18 MED ORDER — BUTORPHANOL TARTRATE 1 MG/ML IJ SOLN
INTRAMUSCULAR | Status: AC
  Filled 2021-08-18: qty 1

## 2021-08-18 MED ORDER — ACETAMINOPHEN 325 MG PO TABS: 650.0000 mg | ORAL_TABLET | ORAL | Status: DC | PRN

## 2021-08-18 MED ORDER — ZOLPIDEM TARTRATE 5 MG PO TABS: 5.0000 mg | ORAL_TABLET | Freq: Every evening | ORAL | Status: DC | PRN

## 2021-08-18 MED ORDER — SENNOSIDES-DOCUSATE SODIUM 8.6-50 MG PO TABS
2.0000 | ORAL_TABLET | Freq: Every day | ORAL | Status: DC
  Administered 2021-08-19 – 2021-08-20 (×2): 2 via ORAL
  Filled 2021-08-18 (×2): qty 2

## 2021-08-18 MED ORDER — COCONUT OIL OIL
1.0000 "application " | TOPICAL_OIL | Status: DC | PRN
  Administered 2021-08-19: 1 via TOPICAL

## 2021-08-18 MED ORDER — ONDANSETRON HCL 4 MG PO TABS: 4.0000 mg | ORAL_TABLET | ORAL | Status: DC | PRN

## 2021-08-18 MED ORDER — PHENYLEPHRINE 40 MCG/ML (10ML) SYRINGE FOR IV PUSH (FOR BLOOD PRESSURE SUPPORT)
80.0000 ug | PREFILLED_SYRINGE | INTRAVENOUS | Status: DC | PRN
  Filled 2021-08-18: qty 10

## 2021-08-18 MED ORDER — LIDOCAINE HCL (PF) 1 % IJ SOLN
INTRAMUSCULAR | Status: DC | PRN
  Administered 2021-08-18 (×2): 5 mL via EPIDURAL

## 2021-08-18 MED ORDER — TETANUS-DIPHTH-ACELL PERTUSSIS 5-2.5-18.5 LF-MCG/0.5 IM SUSY: 0.5000 mL | PREFILLED_SYRINGE | Freq: Once | INTRAMUSCULAR | Status: DC

## 2021-08-18 MED ORDER — WITCH HAZEL-GLYCERIN EX PADS
1.0000 "application " | MEDICATED_PAD | CUTANEOUS | Status: DC | PRN
  Administered 2021-08-19: 1 via TOPICAL

## 2021-08-18 MED ORDER — PRENATAL MULTIVITAMIN CH
1.0000 | ORAL_TABLET | Freq: Every day | ORAL | Status: DC
  Administered 2021-08-19 – 2021-08-20 (×2): 1 via ORAL
  Filled 2021-08-18 (×2): qty 1

## 2021-08-18 MED ORDER — OXYTOCIN-SODIUM CHLORIDE 30-0.9 UT/500ML-% IV SOLN
2.5000 [IU]/h | INTRAVENOUS | Status: DC
  Administered 2021-08-18: 19:00:00 2.5 [IU]/h via INTRAVENOUS
  Filled 2021-08-18: qty 500

## 2021-08-18 MED ORDER — BUTORPHANOL TARTRATE 1 MG/ML IJ SOLN
INTRAMUSCULAR | Status: AC
  Filled 2021-08-18: qty 2

## 2021-08-18 MED ORDER — LACTATED RINGERS IV SOLN
500.0000 mL | Freq: Once | INTRAVENOUS | Status: AC
  Administered 2021-08-18: 08:00:00 500 mL via INTRAVENOUS

## 2021-08-18 MED ORDER — OXYTOCIN BOLUS FROM INFUSION
333.0000 mL | Freq: Once | INTRAVENOUS | Status: AC
  Administered 2021-08-18: 19:00:00 333 mL via INTRAVENOUS

## 2021-08-18 MED ORDER — FLEET ENEMA 7-19 GM/118ML RE ENEM: 1.0000 | ENEMA | RECTAL | Status: DC | PRN

## 2021-08-18 MED ORDER — OXYCODONE-ACETAMINOPHEN 5-325 MG PO TABS: 1.0000 | ORAL_TABLET | ORAL | Status: DC | PRN

## 2021-08-18 MED ORDER — ONDANSETRON HCL 4 MG/2ML IJ SOLN: 4.0000 mg | Freq: Four times a day (QID) | INTRAMUSCULAR | Status: DC | PRN

## 2021-08-18 MED ORDER — DIPHENHYDRAMINE HCL 50 MG/ML IJ SOLN: 12.5000 mg | INTRAMUSCULAR | Status: DC | PRN

## 2021-08-18 MED ORDER — FLEET ENEMA 7-19 GM/118ML RE ENEM: 1.0000 | ENEMA | Freq: Every day | RECTAL | Status: DC | PRN

## 2021-08-18 MED ORDER — LACTATED RINGERS IV SOLN: INTRAVENOUS | Status: DC

## 2021-08-18 MED ORDER — FENTANYL-BUPIVACAINE-NACL 0.5-0.125-0.9 MG/250ML-% EP SOLN
12.0000 mL/h | EPIDURAL | Status: DC | PRN
  Administered 2021-08-18: 14:00:00 10.5 mL/h via EPIDURAL
  Filled 2021-08-18: qty 250

## 2021-08-18 MED ORDER — ONDANSETRON HCL 4 MG/2ML IJ SOLN: 4.0000 mg | INTRAMUSCULAR | Status: DC | PRN

## 2021-08-18 MED ORDER — BISACODYL 10 MG RE SUPP: 10.0000 mg | Freq: Every day | RECTAL | Status: DC | PRN

## 2021-08-18 MED ORDER — PHENYLEPHRINE 40 MCG/ML (10ML) SYRINGE FOR IV PUSH (FOR BLOOD PRESSURE SUPPORT)
80.0000 ug | PREFILLED_SYRINGE | INTRAVENOUS | Status: DC | PRN
  Filled 2021-08-18 (×2): qty 10

## 2021-08-18 MED ORDER — BUTORPHANOL TARTRATE 1 MG/ML IJ SOLN
2.0000 mg | Freq: Once | INTRAMUSCULAR | Status: AC
  Administered 2021-08-18: 09:00:00 2 mg via INTRAVENOUS

## 2021-08-18 NOTE — H&P (Signed)
Jeanne Harper is a 31 y.o. G 2 P 0010 at 80 w 2 days presents in active labor  OB History     Gravida  2   Para  0   Term  0   Preterm  0   AB  1   Living  0      SAB  1   IAB  0   Ectopic  0   Multiple  0   Live Births             Past Medical History:  Diagnosis Date   Anemia    GERD (gastroesophageal reflux disease)    HSV-1 infection    Past Surgical History:  Procedure Laterality Date   NO PAST SURGERIES     Family History: family history includes Hypertension in her mother. Social History:  reports that she has never smoked. She has never used smokeless tobacco. She reports current alcohol use. She reports that she does not use drugs.     Maternal Diabetes: No Genetic Screening: Normal Maternal Ultrasounds/Referrals: Normal Fetal Ultrasounds or other Referrals:  None Maternal Substance Abuse:  No Significant Maternal Medications:  None Significant Maternal Lab Results:  Group B Strep negative Other Comments:  None  Review of Systems  All other systems reviewed and are negative. Maternal Medical History:  Reason for admission: Contractions.    Dilation: 3 Effacement (%): 60 Exam by:: E.Edwards,RN Blood pressure 122/80, pulse 85, temperature 98.3 F (36.8 C), temperature source Oral, resp. rate 18, height 4\' 11"  (1.499 m), weight 75.2 kg, SpO2 95 %, unknown if currently breastfeeding. Maternal Exam:  Uterine Assessment: Contraction strength is moderate.  Abdomen: Fetal presentation: vertex   Fetal Exam Fetal State Assessment: Category I - tracings are normal.  Physical Exam Vitals and nursing note reviewed. Exam conducted with a chaperone present.  Constitutional:      Appearance: Normal appearance.  Cardiovascular:     Rate and Rhythm: Normal rate and regular rhythm.  Neurological:     Mental Status: She is alert.    Prenatal labs: ABO, Rh: B/Positive/-- (06/17 0000) Antibody: Negative (06/17 0000) Rubella: Immune (06/17  0000) RPR: Nonreactive (06/17 0000)  HBsAg: Negative (06/17 0000)  HIV: Non-reactive (06/17 0000)  GBS: Negative/-- (12/28 0000)   Assessment/Plan: IUP at term Labor Epidural Arom after epidural   Cyril Mourning 08/18/2021, 7:58 AM

## 2021-08-18 NOTE — Progress Notes (Signed)
Patient is status post stadol x 2  Still hurting.  FHR Category 1 CERVIX is 80% 6 cm -1 Arom  Scant clear fluid  Patient may have an epidural at any time Patient was very rude to staff and myself after I examined her that the exam was not necessary  I explained to her that a post dates patient needs amniotic fluid evaluated prior to delivery If meconium stained fluid is present then I would need to evaluate prior to delivery.

## 2021-08-18 NOTE — Telephone Encounter (Signed)
Preadmission screen  

## 2021-08-18 NOTE — Anesthesia Procedure Notes (Signed)
Epidural Patient location during procedure: OB Start time: 08/18/2021 1:22 PM End time: 08/18/2021 1:30 PM  Preanesthetic Checklist Completed: patient identified, IV checked, site marked, risks and benefits discussed, surgical consent, monitors and equipment checked, pre-op evaluation and timeout performed  Epidural Patient position: sitting Prep: DuraPrep and site prepped and draped Patient monitoring: continuous pulse ox and blood pressure Approach: midline Location: L3-L4 Injection technique: LOR air  Needle:  Needle type: Tuohy  Needle gauge: 17 G Needle length: 9 cm and 9 Needle insertion depth: 5 cm Catheter type: closed end flexible Catheter size: 19 Gauge Catheter at skin depth: 10 cm Test dose: negative and Other  Assessment Events: blood not aspirated, injection not painful, no injection resistance, no paresthesia and negative IV test  Additional Notes Patient identified. Risks and benefits discussed including failed block, incomplete  Pain control, post dural puncture headache, nerve damage, paralysis, blood pressure Changes, nausea, vomiting, reactions to medications-both toxic and allergic and post Partum back pain. All questions were answered. Patient expressed understanding and wished to proceed. Sterile technique was used throughout procedure. Epidural site was Dressed with sterile barrier dressing. No paresthesias, signs of intravascular injection Or signs of intrathecal spread were encountered.  Patient was more comfortable after the epidural was dosed. Please see RN's note for documentation of vital signs and FHR which are stable. Reason for block:procedure for pain

## 2021-08-18 NOTE — Lactation Note (Signed)
This note was copied from a baby's chart. Lactation Consultation Note  Patient Name: Jeanne Harper [id number] Date: 08/18/2021 Reason for consult: L&D Initial assessment;Mother's request;Primapara;1st time breastfeeding;Term;Breastfeeding assistance Age:33 hours  LC assisted with latching infant at breast. LC used bulb syringe to remove amniotic clear fluid. Infant not able to sustain the latch, doing a few sucks with breast compression.  Parents to receive further LC support on the floor. Mom states breast and pumping her eBM to offer bottle at a later date as her current feeding plan.  All questions answered at the end of the visit.   Maternal Data Has patient been taught Hand Expression?: Yes Does the patient have breastfeeding experience prior to this delivery?: No  Feeding Mother's Current Feeding Choice: Breast Milk  LATCH Score                    Lactation Tools Discussed/Used    Interventions Interventions: Breast feeding basics reviewed;Assisted with latch;Skin to skin;Breast massage;Hand express;Breast compression;Support pillows;Expressed milk;Education  Discharge [place] [facility]: No  Consult Status Consult Status: Follow-up from L&D    Jeanne Harper  Jeanne Harper 08/18/2021, 6:21 PM

## 2021-08-18 NOTE — Anesthesia Preprocedure Evaluation (Signed)
Anesthesia Evaluation  Patient identified by MRN, date of birth, ID band Patient awake    Reviewed: Allergy & Precautions, Patient's Chart, lab work & pertinent test results  Airway Mallampati: II       Dental no notable dental hx.    Pulmonary neg pulmonary ROS,    Pulmonary exam normal        Cardiovascular negative cardio ROS Normal cardiovascular exam     Neuro/Psych negative neurological ROS  negative psych ROS   GI/Hepatic Neg liver ROS, GERD  ,  Endo/Other  Obesity  Renal/GU negative Renal ROS  negative genitourinary   Musculoskeletal negative musculoskeletal ROS (+)   Abdominal (+) + obese,   Peds  Hematology  (+) anemia ,   Anesthesia Other Findings   Reproductive/Obstetrics (+) Pregnancy HSV                             Anesthesia Physical Anesthesia Plan  ASA: 2  Anesthesia Plan: Epidural   Post-op Pain Management:    Induction:   PONV Risk Score and Plan:   Airway Management Planned: Natural Airway  Additional Equipment:   Intra-op Plan:   Post-operative Plan:   Informed Consent: I have reviewed the patients History and Physical, chart, labs and discussed the procedure including the risks, benefits and alternatives for the proposed anesthesia with the patient or authorized representative who has indicated his/her understanding and acceptance.       Plan Discussed with: Anesthesiologist  Anesthesia Plan Comments:         Anesthesia Quick Evaluation

## 2021-08-18 NOTE — Lactation Note (Signed)
This note was copied from a baby's chart. Lactation Consultation Note Attempted to see mom. Mom fixing to eat supper. LC told mom I would be back after she finished eating.  Patient Name: Girl Dulcemaria Bula [id number] Date: 08/18/2021   Age:33 hours  Maternal Data    Feeding    LATCH Score Latch: Grasps breast easily, tongue down, lips flanged, rhythmical sucking.  Audible Swallowing: A few with stimulation  Type of Nipple: Everted at rest and after stimulation  Comfort (Breast/Nipple): Soft / non-tender  Hold (Positioning): Assistance needed to correctly position infant at breast and maintain latch.  LATCH Score: 8   Lactation Tools Discussed/Used    Interventions    Discharge    Consult Status      Theodoro Kalata 08/18/2021, 11:34 PM

## 2021-08-18 NOTE — MAU Note (Signed)
Jeanne Harper is a 31 y.o. at [redacted]w[redacted]d here in MAU reporting: contractions started around 0530. They were about every 2-3 minutes and now are about every 5 minutes. No LOF. Felt FM earlier but states none recently. States she is having some bleeding, reports more than spotting but less than a period.  Onset of complaint: today  Pain score: 8/10  Vitals:   08/18/21 0733  BP: 122/80  Pulse: 85  Resp: 18  Temp: 98.3 F (36.8 C)  SpO2: 95%     FHT:130  Lab orders placed from triage: none

## 2021-08-19 LAB — CBC
HCT: 25.9 % — ABNORMAL LOW (ref 36.0–46.0)
Hemoglobin: 8.9 g/dL — ABNORMAL LOW (ref 12.0–15.0)
MCH: 30 pg (ref 26.0–34.0)
MCHC: 34.4 g/dL (ref 30.0–36.0)
MCV: 87.2 fL (ref 80.0–100.0)
Platelets: 170 10*3/uL (ref 150–400)
RBC: 2.97 MIL/uL — ABNORMAL LOW (ref 3.87–5.11)
RDW: 16.3 % — ABNORMAL HIGH (ref 11.5–15.5)
WBC: 14.8 10*3/uL — ABNORMAL HIGH (ref 4.0–10.5)
nRBC: 0 % (ref 0.0–0.2)

## 2021-08-19 MED ORDER — FERROUS SULFATE 325 (65 FE) MG PO TABS
325.0000 mg | ORAL_TABLET | ORAL | Status: DC
  Administered 2021-08-19: 325 mg via ORAL
  Filled 2021-08-19: qty 1

## 2021-08-19 MED ORDER — DOCUSATE SODIUM 100 MG PO CAPS
100.0000 mg | ORAL_CAPSULE | Freq: Every day | ORAL | Status: DC
  Administered 2021-08-19 – 2021-08-20 (×2): 100 mg via ORAL
  Filled 2021-08-19 (×2): qty 1

## 2021-08-19 NOTE — Progress Notes (Signed)
Postpartum Progress Note  Post Partum Day 1 s/p spontaneous vaginal delivery.  Patient reports well-controlled pain, ambulating without difficulty, voiding spontaneously, tolerating PO.  Vaginal bleeding is appropriate.   Objective: Blood pressure 112/66, pulse 98, temperature 98.4 F (36.9 C), temperature source Oral, resp. rate 17, height 4\' 11"  (1.499 m), weight 75.2 kg, SpO2 96 %, unknown if currently breastfeeding.  Physical Exam:  General: alert and no distress Lochia: appropriate Uterine Fundus: firm DVT Evaluation: No evidence of DVT seen on physical exam.  Recent Labs    08/18/21 0815 08/19/21 0459  HGB 10.1* 8.9*  HCT 31.1* 25.9*    Assessment/Plan: Postpartum Day 1, s/p vaginal delivery. Continue routine postpartum care Acute blood loss anemia - Fe/colace Lactation following Anticipate discharge home later today or tomorrow.    LOS: 1 day   Jeanne Harper 08/19/2021, 7:33 AM

## 2021-08-19 NOTE — Lactation Note (Signed)
This note was copied from a baby's chart. Lactation Consultation Note  Patient Name: Girl Shawnna Pancake [id number] Date: 08/19/2021   Age:33 hours 1st visit Parents are concerned that infant isn't feeding at the breast anymore and not doing well with a bottle.   Mom was hand-pumping when I arrived and it was evident that too much areola had been pulled into the size 24 flanges. A size 21 flange was provided with good results & Mom felt more comfortable with pumping. Hand expression was taught to Mom after a latch attempt. Mom was shown how to use the DEBP & will call for me to return once finished.   2nd visit at 21 hrs: Mom was able to express 2 mL--formula was added to the bottle with the Nfant Slow Flow nipple and infant did well. after learning not to tongue-thrust/hump back of tongue (parents had previously tried Similac yellow slow-flow nipple & Nfant Standard nipple & felt it was too fast). Parents were very pleased that infant did well with the bottle and successfully fed. Mom will continue to pump in anticipation of needing to give bottles.  Aldean Jewett, SLP was called and given an update about need for consult. SLP will see tomorrow morning.   Matthias Hughs [facility] 08/19/2021, 2:03 PM

## 2021-08-19 NOTE — Anesthesia Postprocedure Evaluation (Signed)
Anesthesia Post Note  Patient: Jeanne Harper  Procedure(s) Performed: AN AD [place]     Patient location during evaluation: Mother Baby Anesthesia Type: Epidural Level of consciousness: awake and alert and oriented Pain management: satisfactory to patient Vital Signs Assessment: post-procedure vital signs reviewed and stable Respiratory status: respiratory function stable Cardiovascular status: stable Postop Assessment: no headache, no backache, epidural receding, patient able to bend at knees, no signs of nausea or vomiting, adequate PO intake and able to ambulate Anesthetic complications: no   No notable events documented.  Last Vitals:  Vitals:   08/19/21 0041 08/19/21 0432  BP: (!) 113/56 112/66  Pulse: (!) 102 98  Resp: 18 17  Temp: 37.1 C 36.9 C  SpO2: 97% 96%    Last Pain:  Vitals:   08/19/21 0909  TempSrc:   PainSc: 3    Pain Goal:                   [employer]

## 2021-08-19 NOTE — Lactation Note (Signed)
This note was copied from a baby's chart. Lactation Consultation Note Mom stated baby hasn't really been able to latch well causing pain. LC used finger massage to soften areola and ever nipples more. Used sandwich hold latched baby. Baby doesn't open her mouth wide at all. Needing to flange her upper and lower lip. Labial frenulum noted. Baby protrudes tongue out past lips well. Baby suckled on breast, causing some pain. Pinching noted to nipple. LC fitted #20 NS. Mom stated felt so much better. Flanged lips. Baby BF well. Application demonstrated several times. Shells given to wear in am. Mom shown how to use DEBP & how to disassemble, clean, & reassemble parts. Mom knows to pump q3h for 15-20 min. Mom encouraged to feed baby 8-12 times/24 hours and with feeding cues.   Praised mom for her colostrum that she has. Encouraged occasional breast massage or compression during BF. Mom feels better about the feedings. Encouraged to call for assistance or questions. Lactation brochure given. Patient Name: Jeanne Harper Date: 08/19/2021 Reason for consult: Initial assessment;Primapara;Term Age:60 hours  Maternal Data Has patient been taught Hand Expression?: Yes Does the patient have breastfeeding experience prior to this delivery?: No  Feeding    LATCH Score Latch: Repeated attempts needed to sustain latch, nipple held in mouth throughout feeding, stimulation needed to elicit sucking reflex.  Audible Swallowing: A few with stimulation  Type of Nipple: Everted at rest and after stimulation (short shaft)  Comfort (Breast/Nipple): Filling, red/small blisters or bruises, mild/mod discomfort (edema/filling of breast)  Hold (Positioning): Assistance needed to correctly position infant at breast and maintain latch.  LATCH Score: 6   Lactation Tools Discussed/Used Tools: Shells;Pump Breast pump type: Double-Electric Breast Pump Pump Education: Setup, frequency, and  cleaning;Milk Storage Reason for Pumping: NS/short shaft Pumping frequency: Q 3 hr  Interventions    Discharge    Consult Status Consult Status: Follow-up Date: 08/19/21 Follow-up type: In-patient    Theodoro Kalata 08/19/2021, 12:51 AM

## 2021-08-20 ENCOUNTER — Inpatient Hospital Stay (HOSPITAL_COMMUNITY): Payer: Commercial Managed Care - PPO

## 2021-08-20 ENCOUNTER — Inpatient Hospital Stay (HOSPITAL_COMMUNITY)
Admission: AD | Admit: 2021-08-20 | Payer: Commercial Managed Care - PPO | Source: Home / Self Care | Admitting: Obstetrics and Gynecology

## 2021-08-20 HISTORY — DX: Gastro-esophageal reflux disease without esophagitis: K21.9

## 2021-08-20 HISTORY — DX: Herpesviral infection, unspecified: B00.9

## 2021-08-20 HISTORY — DX: Anemia, unspecified: D64.9

## 2021-08-20 MED ORDER — IBUPROFEN 600 MG PO TABS: 600.0000 mg | ORAL_TABLET | Freq: Four times a day (QID) | ORAL | 0 refills | Status: AC | PRN

## 2021-08-20 MED ORDER — ACETAMINOPHEN 325 MG PO TABS: 650.0000 mg | ORAL_TABLET | ORAL | 0 refills | Status: AC | PRN

## 2021-08-20 NOTE — Progress Notes (Signed)
°   08/20/21 0800  COVID-19  Copy of COVID-19 lab result in Chart? (if applicable) Yes - validated lab result is available in Results Review

## 2021-08-20 NOTE — Discharge Summary (Signed)
Postpartum Discharge Summary  Date of Service updated 08/20/21     Patient Name: Jeanne Harper DOB: [birth year] MRN: [id number]  Date of admission: 08/18/2021 Delivery date:08/18/2021  Delivering provider: Dian Queen  Date of discharge: 08/20/2021  Admitting diagnosis: Normal labor [O80, Z37.9] Vacuum extractor delivery, delivered [O75.9] Intrauterine pregnancy: [redacted]w[redacted]d    Secondary diagnosis:  Principal Problem:   Normal labor Active Problems:   Vacuum extractor delivery, delivered  Additional problems:     Discharge diagnosis: Term Pregnancy Delivered                                              Post partum procedures: Augmentation: AROM and Pitocin Complications: None  Hospital course: Onset of Labor With Vaginal Delivery      31y.o. yo G2P1011 at 475w2das admitted in Active Labor on 08/18/2021. Patient had an uncomplicated labor course as follows:  Membrane Rupture Time/Date: 11:55 AM ,08/18/2021   Delivery Method:Vaginal, Spontaneous  Episiotomy: None  Lacerations:  2nd degree  Patient had an uncomplicated postpartum course.  She is ambulating, tolerating a regular diet, passing flatus, and urinating well. Patient is discharged home in stable condition on 08/20/21.  Newborn Data: Birth date:08/18/2021  Birth time:5:25 PM  Gender:Female  Living status:Living  Apgars:8 ,9  Weight:3459 g   Magnesium Sulfate received: No BMZ received: No Rhophylac:No MMR:No T-DaP:Given prenatally Flu: No Transfusion:No  Physical exam  Vitals:   08/19/21 0432 08/19/21 1707 08/19/21 2100 08/20/21 0512  BP: 112/66 114/64 121/76 107/76  Pulse: 98 89 91 87  Resp: 17   17  Temp: 98.4 F (36.9 C)  97.8 F (36.6 C) 98.3 F (36.8 C)  TempSrc: Oral  Oral   SpO2: 96%   98%  Weight:      Height:       General: alert, cooperative, and no distress Lochia: appropriate Uterine Fundus: firm Incision: Healing well with no significant drainage DVT Evaluation: No evidence of  DVT seen on physical exam. Labs: Lab Results  Component Value Date   WBC 14.8 (H) 08/19/2021   HGB 8.9 (L) 08/19/2021   HCT 25.9 (L) 08/19/2021   MCV 87.2 08/19/2021   PLT 170 08/19/2021   No flowsheet data found. Edinburgh Score: Edinburgh Postnatal Depression Scale Screening Tool 08/19/2021  I have been able to laugh and see the funny side of things. 0  I have looked forward with enjoyment to things. 0  I have blamed myself unnecessarily when things went wrong. 1  I have been anxious or worried for no good reason. 1  I have felt scared or panicky for no good reason. 0  Things have been getting on top of me. 1  I have been so unhappy that I have had difficulty sleeping. 0  I have felt sad or miserable. 1  I have been so unhappy that I have been crying. 0  The thought of harming myself has occurred to me. 0  Edinburgh Postnatal Depression Scale Total 4      After visit meds:  Allergies as of 08/20/2021   No Active Allergies      Medication List     STOP taking these medications    famotidine 20 MG tablet Commonly known as: PEPCID       TAKE these medications    acetaminophen 325 MG tablet Commonly known as:  Tylenol Take 2 tablets (650 mg total) by mouth every 4 (four) hours as needed (for pain scale < 4).   ibuprofen 600 MG tablet Commonly known as: ADVIL Take 1 tablet (600 mg total) by mouth every 6 (six) hours as needed.   PRENATAL VITAMIN PO Take 1 tablet by mouth daily.         Discharge home in stable condition Infant Feeding: Breast Infant Disposition:home with mother Discharge instruction: per After Visit Summary and Postpartum booklet. Activity: Advance as tolerated. Pelvic rest for 6 weeks.  Diet: routine diet Anticipated Birth Control: Unsure Postpartum Appointment:6 weeks Additional Postpartum F/U:  Future Appointments:No future appointments. Follow up Visit:      08/20/2021 Allena Katz, MD

## 2021-08-31 ENCOUNTER — Telehealth (HOSPITAL_COMMUNITY): Payer: Self-pay

## 2021-08-31 NOTE — Telephone Encounter (Signed)
No answer. Left message to return nurse call.  Sharyn Lull [facility] 08/31/2021,1408
